# Patient Record
Sex: Female | Born: 1970
Health system: Southern US, Community
[De-identification: ages and names within clinical notes are randomized; demographics above are authoritative.]

## PROBLEM LIST (undated history)

## (undated) HISTORY — PX: NEPHRECTOMY: SHX65

---

## 1998-04-10 ENCOUNTER — Other Ambulatory Visit: Admission: RE | Admit: 1998-04-10 | Discharge: 1998-04-10 | Payer: Self-pay | Admitting: Obstetrics and Gynecology

## 1998-05-05 ENCOUNTER — Encounter: Payer: Self-pay | Admitting: Obstetrics and Gynecology

## 1998-05-05 ENCOUNTER — Ambulatory Visit (HOSPITAL_COMMUNITY): Admission: RE | Admit: 1998-05-05 | Discharge: 1998-05-05 | Payer: Self-pay | Admitting: Obstetrics and Gynecology

## 1998-08-21 ENCOUNTER — Encounter (HOSPITAL_COMMUNITY): Admission: RE | Admit: 1998-08-21 | Discharge: 1998-08-23 | Payer: Self-pay | Admitting: Obstetrics

## 1998-08-22 ENCOUNTER — Inpatient Hospital Stay (HOSPITAL_COMMUNITY): Admission: AD | Admit: 1998-08-22 | Discharge: 1998-08-24 | Payer: Self-pay | Admitting: *Deleted

## 1998-12-15 ENCOUNTER — Inpatient Hospital Stay (HOSPITAL_COMMUNITY): Admission: AD | Admit: 1998-12-15 | Discharge: 1998-12-15 | Payer: Self-pay | Admitting: Obstetrics & Gynecology

## 1998-12-16 ENCOUNTER — Encounter (HOSPITAL_COMMUNITY): Admission: RE | Admit: 1998-12-16 | Discharge: 1999-03-16 | Payer: Self-pay | Admitting: Obstetrics & Gynecology

## 2003-02-07 ENCOUNTER — Emergency Department (HOSPITAL_COMMUNITY): Admission: EM | Admit: 2003-02-07 | Discharge: 2003-02-07 | Payer: Self-pay | Admitting: Emergency Medicine

## 2003-08-20 ENCOUNTER — Emergency Department (HOSPITAL_COMMUNITY): Admission: EM | Admit: 2003-08-20 | Discharge: 2003-08-21 | Payer: Self-pay | Admitting: Emergency Medicine

## 2004-11-06 ENCOUNTER — Inpatient Hospital Stay (HOSPITAL_COMMUNITY): Admission: AD | Admit: 2004-11-06 | Discharge: 2004-11-06 | Payer: Self-pay | Admitting: *Deleted

## 2005-06-29 ENCOUNTER — Inpatient Hospital Stay (HOSPITAL_COMMUNITY): Admission: AD | Admit: 2005-06-29 | Discharge: 2005-07-01 | Payer: Self-pay | Admitting: Obstetrics & Gynecology

## 2005-08-13 ENCOUNTER — Other Ambulatory Visit: Admission: RE | Admit: 2005-08-13 | Discharge: 2005-08-13 | Payer: Self-pay | Admitting: Obstetrics and Gynecology

## 2006-02-22 ENCOUNTER — Emergency Department (HOSPITAL_COMMUNITY): Admission: EM | Admit: 2006-02-22 | Discharge: 2006-02-22 | Payer: Self-pay | Admitting: Emergency Medicine

## 2006-06-19 ENCOUNTER — Emergency Department (HOSPITAL_COMMUNITY): Admission: EM | Admit: 2006-06-19 | Discharge: 2006-06-19 | Payer: Self-pay | Admitting: Emergency Medicine

## 2007-02-16 ENCOUNTER — Ambulatory Visit: Payer: Self-pay | Admitting: Internal Medicine

## 2007-02-16 ENCOUNTER — Encounter (INDEPENDENT_AMBULATORY_CARE_PROVIDER_SITE_OTHER): Payer: Self-pay | Admitting: Family Medicine

## 2007-02-16 LAB — CONVERTED CEMR LAB
ALT: 14 units/L (ref 0–35)
Albumin: 4.3 g/dL (ref 3.5–5.2)
Alkaline Phosphatase: 111 units/L (ref 39–117)
Basophils Absolute: 0 10*3/uL (ref 0.0–0.1)
Eosinophils Absolute: 0 10*3/uL — ABNORMAL LOW (ref 0.2–0.7)
Eosinophils Relative: 0 % (ref 0–5)
Glucose, Bld: 81 mg/dL (ref 70–99)
LDL Cholesterol: 126 mg/dL — ABNORMAL HIGH (ref 0–99)
Lymphocytes Relative: 44 % (ref 12–46)
Lymphs Abs: 2.1 10*3/uL (ref 0.7–4.0)
MCV: 85.6 fL (ref 78.0–100.0)
Neutrophils Relative %: 49 % (ref 43–77)
Platelets: 321 10*3/uL (ref 150–400)
Potassium: 4.4 meq/L (ref 3.5–5.3)
RDW: 12.9 % (ref 11.5–15.5)
Sodium: 136 meq/L (ref 135–145)
Total Bilirubin: 0.4 mg/dL (ref 0.3–1.2)
Total Protein: 8.1 g/dL (ref 6.0–8.3)
Triglycerides: 114 mg/dL (ref <150)
VLDL: 23 mg/dL (ref 0–40)
WBC: 4.8 10*3/uL (ref 4.0–10.5)

## 2007-03-02 ENCOUNTER — Encounter: Admission: RE | Admit: 2007-03-02 | Discharge: 2007-03-30 | Payer: Self-pay | Admitting: Family Medicine

## 2007-03-24 ENCOUNTER — Ambulatory Visit: Payer: Self-pay | Admitting: Internal Medicine

## 2007-03-24 ENCOUNTER — Ambulatory Visit: Payer: Self-pay | Admitting: *Deleted

## 2007-04-10 ENCOUNTER — Ambulatory Visit: Payer: Self-pay | Admitting: Internal Medicine

## 2007-07-14 ENCOUNTER — Ambulatory Visit: Payer: Self-pay | Admitting: Internal Medicine

## 2007-09-30 ENCOUNTER — Ambulatory Visit: Payer: Self-pay | Admitting: Family Medicine

## 2007-09-30 LAB — CONVERTED CEMR LAB
ALT: 11 units/L (ref 0–35)
AST: 15 units/L (ref 0–37)
Albumin: 4 g/dL (ref 3.5–5.2)
CO2: 24 meq/L (ref 19–32)
Calcium: 9.4 mg/dL (ref 8.4–10.5)
Chloride: 105 meq/L (ref 96–112)
Cholesterol: 152 mg/dL (ref 0–200)
Potassium: 4.4 meq/L (ref 3.5–5.3)
Sodium: 138 meq/L (ref 135–145)
Total CHOL/HDL Ratio: 3.5
Total Protein: 7.1 g/dL (ref 6.0–8.3)

## 2008-03-22 ENCOUNTER — Ambulatory Visit: Payer: Self-pay | Admitting: Family Medicine

## 2008-03-23 ENCOUNTER — Emergency Department (HOSPITAL_COMMUNITY): Admission: EM | Admit: 2008-03-23 | Discharge: 2008-03-23 | Payer: Self-pay | Admitting: Emergency Medicine

## 2008-04-05 ENCOUNTER — Ambulatory Visit: Payer: Self-pay | Admitting: Family Medicine

## 2008-11-03 ENCOUNTER — Ambulatory Visit: Payer: Self-pay | Admitting: Family Medicine

## 2010-06-19 ENCOUNTER — Emergency Department (HOSPITAL_COMMUNITY)
Admission: EM | Admit: 2010-06-19 | Discharge: 2010-06-20 | Disposition: A | Discharge status: Home / Self Care | Payer: Medicaid Other | Attending: Emergency Medicine | Admitting: Emergency Medicine

## 2010-06-19 ENCOUNTER — Emergency Department (HOSPITAL_COMMUNITY): Payer: Medicaid Other

## 2010-06-19 DIAGNOSIS — R531 Weakness: Secondary | ICD-10-CM

## 2010-06-19 DIAGNOSIS — R5381 Other malaise: Secondary | ICD-10-CM | POA: Insufficient documentation

## 2010-06-19 DIAGNOSIS — R509 Fever, unspecified: Secondary | ICD-10-CM | POA: Insufficient documentation

## 2010-06-19 DIAGNOSIS — R059 Cough, unspecified: Secondary | ICD-10-CM | POA: Insufficient documentation

## 2010-06-19 DIAGNOSIS — IMO0001 Reserved for inherently not codable concepts without codable children: Secondary | ICD-10-CM | POA: Insufficient documentation

## 2010-06-19 DIAGNOSIS — R5383 Other fatigue: Secondary | ICD-10-CM | POA: Insufficient documentation

## 2010-06-19 DIAGNOSIS — R42 Dizziness and giddiness: Secondary | ICD-10-CM | POA: Insufficient documentation

## 2010-06-19 DIAGNOSIS — R112 Nausea with vomiting, unspecified: Secondary | ICD-10-CM | POA: Insufficient documentation

## 2010-06-19 DIAGNOSIS — R300 Dysuria: Secondary | ICD-10-CM | POA: Insufficient documentation

## 2010-06-19 DIAGNOSIS — R05 Cough: Secondary | ICD-10-CM | POA: Insufficient documentation

## 2010-06-19 LAB — URINALYSIS, ROUTINE W REFLEX MICROSCOPIC
Glucose, UA: NEGATIVE mg/dL
Ketones, ur: NEGATIVE mg/dL
Leukocytes, UA: NEGATIVE
Nitrite: NEGATIVE
Specific Gravity, Urine: 1.03 (ref 1.005–1.030)
pH: 6.5 (ref 5.0–8.0)

## 2010-06-19 LAB — CBC
HCT: 38.6 % (ref 36.0–46.0)
MCHC: 33.2 g/dL (ref 30.0–36.0)
MCV: 87.3 fL (ref 78.0–100.0)
Platelets: 250 10*3/uL (ref 150–400)
RDW: 12.2 % (ref 11.5–15.5)

## 2010-06-19 LAB — DIFFERENTIAL
Basophils Absolute: 0 10*3/uL (ref 0.0–0.1)
Eosinophils Absolute: 0.1 10*3/uL (ref 0.0–0.7)
Eosinophils Relative: 2 % (ref 0–5)
Lymphocytes Relative: 57 % — ABNORMAL HIGH (ref 12–46)
Monocytes Absolute: 0.5 10*3/uL (ref 0.1–1.0)

## 2010-06-19 LAB — URINE MICROSCOPIC-ADD ON

## 2010-06-19 LAB — PREGNANCY, URINE: Preg Test, Ur: NEGATIVE

## 2010-06-20 ENCOUNTER — Emergency Department (HOSPITAL_COMMUNITY): Payer: Medicaid Other

## 2010-06-20 LAB — BASIC METABOLIC PANEL
BUN: 17 mg/dL (ref 6–23)
Calcium: 9.2 mg/dL (ref 8.4–10.5)
GFR calc non Af Amer: >60 mL/min (ref >60)
Glucose, Bld: 98 mg/dL (ref 70–99)

## 2010-06-21 LAB — URINE CULTURE

## 2010-08-24 NOTE — H&P (Signed)
[name], [name]             ACCOUNT NO.:  [id number]   MEDICAL RECORD NO.:  [id number]          PATIENT TYPE:  INP   LOCATION:  9174                          FACILITY:  [facility]   PHYSICIAN:  Hal Morales, M.D.DATE OF BIRTH:  [birth year]   DATE OF ADMISSION:  06/29/2005  DATE OF DISCHARGE:                                HISTORY & PHYSICAL   HISTORY OF PRESENT ILLNESS:  Ms. [name] is a 40 year old gravida 5, para 4-  0-0-4, who presents at 26 and 6/7 weeks with complaints of painful regular  contractions since 5 p.m. day of admission.  The patient denies leakage of  fluid or bleeding.  The patient reports her fetus has been moving normally.  The patient reports that she has had some bloody mucus.  The patient reports  her contractions are approximately every 5-7 minutes at the present time.  Pregnancy remarkable for:  1.  Grand multiparity.  2.  History of previous clitoridectomy, partial.  3.  History of rapid labors.  4.  History of maternal atrophic left kidney with lithiasis and functionally      solitary right kidney.  5.  Fetal pyelectasis.   The patient denies headaches, vision changes, right upper quadrant pain or  edema.   PRENATAL LABORATORY:  Blood type is O positive.  Antibody screen negative.  Initial hemoglobin 10.9, hemoglobin at 27 weeks 11.4 and platelets 226,000.  Rubella titer immune, hepatitis B surface antigen negative, syphilis  nonreactive, HIV was declined, initial gonorrhea and Chlamydia negative,  group B strep was negative and 1-hour Glucola 107.  Cystic fibrosis screen  negative and quad screen declined.   HISTORY OF PRESENT PREGNANCY:  The patient entered care at [redacted] weeks  gestation.  The pregnancy has been followed by the CNM Service at [facility].  The patient has been evaluated by nephrology during the  course of her pregnancy secondary to her history of atrophic left kidney.  Ultrasound done at 18 weeks for anatomy with  limited anatomy visualized at  that point in time.  Normal amniotic fluid volume was noted.  Cervix 6.69  cm.  The patient with a UTI at 21 weeks' gestation with a positive urine  culture for E coli treated with Macrobid.  Repeat culture done at 22 weeks  was negative.  Anatomy ultrasound completed at 22 weeks.  Glucola  administered at 26 weeks with negative results as noted above.  The patient  was placed on Protonix for complaints of GERD at 26 weeks.  Monthly Chem-9  at 26 weeks was negative.  The patient with left flank pain at 27 weeks.  Urine sent to culture.  The patient given Vicodin at that time for back  pain.  Urine culture repeated at 28 weeks and was negative.  Ultrasound was  repeated at 30 weeks secondary to size greater than dates.  At that time,  AFI 23.1 cm, estimated fetal weight 58th percentile and bilateral mild  pyelectasis noted.  Right renal pelvis measuring 0.65 cm and left renal  pelvis 0.63 cm.  Ultrasound repeated 32 weeks for amniotic fluid index  with  a normal amniotic fluid index.  At that point in time, the estimated fetal  weight at the 80th percentile per [place].  Bilateral renal pyelectasis  with the right slightly increased to 0.83 cm and the left stable.  Ultrasound repeated at 37 weeks for fetal renal pyelectasis noting right  renal pyelectasis to be stable at 0.8 cm and left-sided pyelectasis  resolved, normal amniotic fluid index 13 cm and cervix 3.2 cm.  The patient  declined third trimester gonorrhea and Chlamydia cultures, and GBS was  obtained.  Chem-9 was negative.  Complete metabolic panel repeated at 38  weeks, revealed a normal BUN and creatinine, and group B strep negative.  The remainder of the patient's pregnancy has been unremarkable.  The  patient's last menstrual period September 23, 2004 with an [facility] of June 30, 2005,  which was confirmed by ultrasound.   OBSTETRIC HISTORY:  Pregnancy #1 August of 1992, spontaneous vaginal  delivery,  female infant approximately 7 pounds at term gestation, 11-hour  labor and fetus born in [place].  December 1995, spontaneous vaginal delivery  at term gestation, female infant approximately 6 pounds, 2-hour labor and born  in [place] with no complications.  October of 1997, spontaneous vaginal  delivery, female infant approximately 7 pounds at term gestation, 6-hour labor  and born in [place] with no complications.  May of 2000, spontaneous vaginal  delivery, female infant, 7 pounds 6 ounces at 15 weeks' gestation, 4-hour  labor at [facility] in [place] with no complications and current  pregnancy is #5.   GYNECOLOGIC HISTORY:  The patient denies history of abnormal Paps or STDs.  The patient reports regular monthly menses.   ALLERGIES:  NO KNOWN DRUG ALLERGIES.   CURRENT MEDICATIONS:  Prenatal vitamins only.   FAMILY HISTORY:  Mother with diabetes, otherwise negative.   GENETIC HISTORY:  Negative.   SOCIAL HISTORY:  The patient is married to the father of the baby, Saifal  Nael.  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  The  father of the baby has 12 years of education and works in distribution  center.  The patient and her husband are Muslim in their faith.  The patient  denies use of alcohol, tobacco or street drugs.   PHYSICAL EXAMINATION:  VITAL SIGNS:  The patient is afebrile.  Vital signs  are stable.  HEENT:  Within normal limits.  LUNGS:  Clear.  HEART:  Regular rate and rhythm.  BREASTS:  Soft.  ABDOMEN:  Soft, gravid and nontender with fundal height extending 40 cm  above the symphysis pubis.  Fetal heart rate 150s-160s with variability  noted, occasional mild variables are noted and reactivity is present.  Uterine contractions every 5-7 minutes.  CERVIX:  4 cm dilated, 80 % effaced and -2 station.  EXTREMITIES:  No edema, DTRs are 1+, no clonus and negative Homans.   ASSESSMENT:  1.  Intrauterine pregnancy at 79 and 6/7 weeks.  2.  Active  labor.   PLAN:  1.  The patient will be admitted to birthing suites per a consult with Dr.      Dierdre Forth.  2.  Routine CNM orders.  3.  The patient plans epidural, which was ordered.  The patient with      complete metabolic panel ordered, as well as PIH labs secondary to      solitary elevated blood pressure on admission of 146/95 and we check a  clean-catch urine for protein.  The patient with no history of      hypertension.      Rhona Leavens, CNM      Hal Morales, M.D.  Electronically Signed    NOS/MEDQ  D:  06/29/2005  T:  07/01/2005  Job:  [id number]

## 2010-11-07 ENCOUNTER — Emergency Department (HOSPITAL_COMMUNITY)
Admission: EM | Admit: 2010-11-07 | Discharge: 2010-11-07 | Disposition: A | Discharge status: Home / Self Care | Payer: Self-pay | Attending: Emergency Medicine | Admitting: Emergency Medicine

## 2010-11-07 ENCOUNTER — Emergency Department (HOSPITAL_COMMUNITY): Payer: Self-pay

## 2010-11-07 DIAGNOSIS — R6883 Chills (without fever): Secondary | ICD-10-CM | POA: Insufficient documentation

## 2010-11-07 DIAGNOSIS — R079 Chest pain, unspecified: Secondary | ICD-10-CM | POA: Insufficient documentation

## 2010-11-07 DIAGNOSIS — IMO0001 Reserved for inherently not codable concepts without codable children: Secondary | ICD-10-CM | POA: Insufficient documentation

## 2010-11-07 LAB — URINE MICROSCOPIC-ADD ON

## 2010-11-07 LAB — URINALYSIS, ROUTINE W REFLEX MICROSCOPIC
Glucose, UA: NEGATIVE mg/dL
Leukocytes, UA: NEGATIVE
Protein, ur: NEGATIVE mg/dL
Specific Gravity, Urine: 1.021 (ref 1.005–1.030)
pH: 6 (ref 5.0–8.0)

## 2010-11-07 LAB — POCT I-STAT, CHEM 8
BUN: 12 mg/dL (ref 6–23)
Chloride: 106 mEq/L (ref 96–112)
Creatinine, Ser: 0.8 mg/dL (ref 0.50–1.10)
Potassium: 4.4 mEq/L (ref 3.5–5.1)
Sodium: 138 mEq/L (ref 135–145)

## 2011-01-11 LAB — BASIC METABOLIC PANEL
CO2: 26 mEq/L (ref 19–32)
Calcium: 9.8 mg/dL (ref 8.4–10.5)
GFR calc Af Amer: >60 mL/min (ref >60)
GFR calc non Af Amer: >60 mL/min (ref >60)
Sodium: 139 mEq/L (ref 135–145)

## 2011-01-11 LAB — URINALYSIS, ROUTINE W REFLEX MICROSCOPIC
Bilirubin Urine: NEGATIVE
Specific Gravity, Urine: 1.012 (ref 1.005–1.030)
Urobilinogen, UA: 1 mg/dL (ref 0.0–1.0)

## 2011-01-11 LAB — DIFFERENTIAL
Basophils Absolute: 0.1 10*3/uL (ref 0.0–0.1)
Basophils Relative: 2 % — ABNORMAL HIGH (ref 0–1)
Eosinophils Absolute: 0.1 10*3/uL (ref 0.0–0.7)
Eosinophils Relative: 1 % (ref 0–5)
Monocytes Absolute: 0.4 10*3/uL (ref 0.1–1.0)
Monocytes Relative: 8 % (ref 3–12)
Neutro Abs: 2.7 10*3/uL (ref 1.7–7.7)

## 2011-01-11 LAB — CBC
Hemoglobin: 13.6 g/dL (ref 12.0–15.0)
RBC: 4.72 MIL/uL (ref 3.87–5.11)

## 2011-01-11 LAB — URINE MICROSCOPIC-ADD ON

## 2013-03-20 ENCOUNTER — Ambulatory Visit (INDEPENDENT_AMBULATORY_CARE_PROVIDER_SITE_OTHER): Payer: BC Managed Care – PPO | Admitting: Internal Medicine

## 2013-03-20 VITALS — BP 132/80 | HR 67 | Temp 98.0°F | Resp 16 | Ht 63.0 in | Wt 177.6 lb

## 2013-03-20 DIAGNOSIS — R109 Unspecified abdominal pain: Secondary | ICD-10-CM

## 2013-03-20 DIAGNOSIS — R350 Frequency of micturition: Secondary | ICD-10-CM

## 2013-03-20 DIAGNOSIS — R42 Dizziness and giddiness: Secondary | ICD-10-CM

## 2013-03-20 LAB — POCT URINALYSIS DIPSTICK
Bilirubin, UA: NEGATIVE
Blood, UA: NEGATIVE
Glucose, UA: NEGATIVE
Ketones, UA: NEGATIVE
Leukocytes, UA: NEGATIVE
Nitrite, UA: NEGATIVE
Protein, UA: NEGATIVE
Spec Grav, UA: <=1.005
Urobilinogen, UA: 0.2
pH, UA: 6

## 2013-03-20 LAB — POCT UA - MICROSCOPIC ONLY
Bacteria, U Microscopic: NEGATIVE
Casts, Ur, LPF, POC: NEGATIVE
Crystals, Ur, HPF, POC: NEGATIVE
Epithelial cells, urine per micros: NEGATIVE
Mucus, UA: NEGATIVE
RBC, urine, microscopic: NEGATIVE
WBC, Ur, HPF, POC: NEGATIVE
Yeast, UA: NEGATIVE

## 2013-03-20 MED ORDER — CIPROFLOXACIN HCL 250 MG PO TABS: 250.0000 mg | ORAL_TABLET | Freq: Two times a day (BID) | ORAL | Status: DC

## 2013-03-20 NOTE — Progress Notes (Addendum)
Subjective:  This chart was scribed for Anne Sia, MD by Carl Best, Medical Scribe. This patient was seen in Room 13 and the patient's care was started at 5:40 PM.  Patient ID: [name], female    DOB: [birth year], 42 y.o.   MRN: [id number]  HPI HPI Comments: [name] is a 42 y.o. female who presents to the [facility] complaining of left-sided flank pain that started a week ago and changes in her urine that started today.  She denies fever as an associated symptom.  She lists dysuria and frequency as associated symptoms.  She states that she has experienced a UTI in the past.  The patient denies having a history of infection in her kidney stones in the past.  She denies being on any types of medication.  She is not dizzy as noted in the presenting complaint.  History reviewed. No pertinent past medical history. Past Surgical History  Procedure Laterality Date  . Nephrectomy     Family History  Problem Relation Age of Onset  . Diabetes Mother    History   Social History  . Marital Status: Married    Spouse Name: N/A    Number of Children: N/A  . Years of Education: N/A   Occupational History  . Not on file.   Social History Main Topics  . Smoking status: Never Smoker   . Smokeless tobacco: Not on file  . Alcohol Use: No  . Drug Use: No  . Sexual Activity: Not on file   Other Topics Concern  . Not on file   Social History Narrative  . No narrative on file   No Known Allergies   Review of Systems  Constitutional: Negative for fever.  Genitourinary: Positive for dysuria and frequency.  All other systems reviewed and are negative.     Objective:  Physical Exam  Nursing note and vitals reviewed. Constitutional: She is oriented to person, place, and time. She appears well-developed and well-nourished. No distress.  HENT:  Head: Normocephalic and atraumatic.  Eyes: Conjunctivae and EOM are normal. Pupils are equal, round,  and reactive to light.  Neck: Neck supple. No thyromegaly present.  Cardiovascular: Normal rate, regular rhythm and normal heart sounds.   Pulmonary/Chest: Effort normal and breath sounds normal.  Abdominal: Soft. Bowel sounds are normal. She exhibits no distension. There is no tenderness. There is no rebound.  Mildly tender in the flanks to percussion  Lymphadenopathy:    She has no cervical adenopathy.  Neurological: She is alert and oriented to person, place, and time. No cranial nerve deficit.  Psychiatric: She has a normal mood and affect. Her behavior is normal.   BP 132/80  Pulse 67  Temp(Src) 98 F (36.7 C) (Oral)  Resp 16  Ht 5\' 3"  (1.6 m)  Wt 177 lb 9.6 oz (80.559 kg)  BMI 31.47 kg/m2  SpO2 100%  Results for orders placed in visit on 03/20/13  POCT UA - MICROSCOPIC ONLY      Result Value Range   WBC, Ur, HPF, POC neg     RBC, urine, microscopic neg     Bacteria, U Microscopic neg     Mucus, UA neg     Epithelial cells, urine per micros neg     Crystals, Ur, HPF, POC neg     Casts, Ur, LPF, POC neg     Yeast, UA neg    POCT URINALYSIS DIPSTICK      Result Value Range  Color, UA bright yellow \     Clarity, UA clear     Glucose, UA neg     Bilirubin, UA neg     Ketones, UA neg     Spec Grav, UA <=1.005     Blood, UA neg     pH, UA 6.0     Protein, UA neg     Urobilinogen, UA 0.2     Nitrite, UA neg     Leukocytes, UA Negative         Assessment & Plan:   I personally performed the services described in this documentation, which was scribed in my presence. The recorded information has been reviewed and is accurate. I have completed the patient encounter in its entirety as documented by the scribe, with editing by me where necessary. Mekel Haverstock P. Merla Riches, M.D. Urinary frequency/flank discomfort- Plan: Urine culture  "Cover with Cipro due to past history awaiting culture results

## 2013-03-22 LAB — URINE CULTURE
Colony Count: NO GROWTH
Organism ID, Bacteria: NO GROWTH

## 2013-03-25 ENCOUNTER — Ambulatory Visit: Payer: BC Managed Care – PPO

## 2013-06-19 ENCOUNTER — Ambulatory Visit (INDEPENDENT_AMBULATORY_CARE_PROVIDER_SITE_OTHER): Payer: BC Managed Care – PPO | Admitting: Emergency Medicine

## 2013-06-19 VITALS — BP 128/74 | HR 78 | Temp 98.8°F | Resp 17 | Ht 61.0 in | Wt 166.0 lb

## 2013-06-19 DIAGNOSIS — R197 Diarrhea, unspecified: Secondary | ICD-10-CM

## 2013-06-19 LAB — POCT CBC
Granulocyte percent: 57.9 %G (ref 37–80)
HCT, POC: 43.5 % (ref 37.7–47.9)
HEMOGLOBIN: 13.9 g/dL (ref 12.2–16.2)
LYMPH, POC: 1.2 (ref 0.6–3.4)
MCH: 28.8 pg (ref 27–31.2)
MCHC: 32 g/dL (ref 31.8–35.4)
MCV: 90.2 fL (ref 80–97)
MID (CBC): 0.4 (ref 0–0.9)
MPV: 9.3 fL (ref 0–99.8)
PLATELET COUNT, POC: 296 10*3/uL (ref 142–424)
POC Granulocyte: 2.2 (ref 2–6.9)
POC LYMPH PERCENT: 30.9 %L (ref 10–50)
POC MID %: 11.2 % (ref 0–12)
RBC: 4.82 M/uL (ref 4.04–5.48)
RDW, POC: 12.6 %
WBC: 3.8 10*3/uL — AB (ref 4.6–10.2)

## 2013-06-19 MED ORDER — ONDANSETRON 8 MG PO TBDP: 8.0000 mg | ORAL_TABLET | Freq: Three times a day (TID) | ORAL | Status: DC | PRN

## 2013-06-19 NOTE — Progress Notes (Addendum)
   Subjective:    Patient ID: [name] Strojny, female    DOB: 10/26/1970, 43 y.o.   MRN: [id number] This chart was scribed for Lesle ChrisSteven Daub, MD by Nicholos Johnsenise Iheanachor, Medical Scribe. This patient's care was started at 2:20 PM.  Abdominal Pain Associated symptoms include diarrhea and vomiting. Pertinent negatives include no fever.  Diarrhea  Associated symptoms include abdominal pain, chills and vomiting. Pertinent negatives include no fever.   HPI Comments: [name] Housh is a 43 y.o. female who presents to the [facility] complaining of intermittent chills, abdominal pain, diarrhea, onset 3 days ago. States diarrhea was really bad 3 days ago, got better on day 2 and has since gotten worse today. Reports 7 episodes of diarrhea today; watery and yellow in color. States abdominal pain feels like a cramping sensation when she used the bathroom. Also reports some episodes of emesis. Has not taken any medication for symptoms. Was on antibiotics in 12/14 for a UTI. Denies sick contacts. Denies any recent travel. Denies any other medical problems. Denies fever, sore throat, congestion, cough, hematochezia  Review of Systems  Constitutional: Positive for chills. Negative for fever.  HENT: Negative for congestion and sore throat.   Gastrointestinal: Positive for vomiting, abdominal pain and diarrhea. Negative for blood in stool.    Objective:  Physical Exam  CONSTITUTIONAL: Well developed/well nourished HEAD: Normocephalic/atraumatic EYES: EOMI/PERRL ENMT: Mucous membranes moist NECK: supple no meningeal signs SPINE:entire spine nontender CV: S1/S2 noted, no murmurs/rubs/gallops noted LUNGS: Lungs are clear to auscultation bilaterally, no apparent distress ABDOMEN: soft, nontender, no rebound or guarding GU:no cva tenderness NEURO: Pt is awake/alert, moves all extremitiesx4 EXTREMITIES: pulses normal, full ROM SKIN: warm, color normal PSYCH: no abnormalities of mood  noted Results for orders placed in visit on 06/19/13  POCT CBC      Result Value Ref Range   WBC 3.8 (*) 4.6 - 10.2 K/uL   Lymph, poc 1.2  0.6 - 3.4   POC LYMPH PERCENT 30.9  10 - 50 %L   MID (cbc) 0.4  0 - 0.9   POC MID % 11.2  0 - 12 %M   POC Granulocyte 2.2  2 - 6.9   Granulocyte percent 57.9  37 - 80 %G   RBC 4.82  4.04 - 5.48 M/uL   Hemoglobin 13.9  12.2 - 16.2 g/dL   HCT, POC 40.943.5  81.137.7 - 47.9 %   MCV 90.2  80 - 97 fL   MCH, POC 28.8  27 - 31.2 pg   MCHC 32.0  31.8 - 35.4 g/dL   RDW, POC 91.412.6     Platelet Count, POC 296  142 - 424 K/uL   MPV 9.3  0 - 99.8 fL   Assessment & Plan:  This appears to be a viral gastroenteritis with a white count of 3800. She did take antibiotics in December but has not been on any antibiotics recently and her count appears viral . We'll treat symptomatically with Zofran and Imodium A-D. I personally performed the services described in this documentation, which was scribed in my presence. The recorded information has been reviewed and is accurate.

## 2013-06-19 NOTE — Patient Instructions (Signed)
Viral Gastroenteritis Viral gastroenteritis is also known as stomach flu. This condition affects the stomach and intestinal tract. It can cause sudden diarrhea and vomiting. The illness typically lasts 3 to 8 days. Most people develop an immune response that eventually gets rid of the virus. While this natural response develops, the virus can make you quite ill. CAUSES  Many different viruses can cause gastroenteritis, such as rotavirus or noroviruses. You can catch one of these viruses by consuming contaminated food or water. You may also catch a virus by sharing utensils or other personal items with an infected person or by touching a contaminated surface. SYMPTOMS  The most common symptoms are diarrhea and vomiting. These problems can cause a severe loss of body fluids (dehydration) and a body salt (electrolyte) imbalance. Other symptoms may include:  Fever.  Headache.  Fatigue.  Abdominal pain. DIAGNOSIS  Your caregiver can usually diagnose viral gastroenteritis based on your symptoms and a physical exam. A stool sample may also be taken to test for the presence of viruses or other infections. TREATMENT  This illness typically goes away on its own. Treatments are aimed at rehydration. The most serious cases of viral gastroenteritis involve vomiting so severely that you are not able to keep fluids down. In these cases, fluids must be given through an intravenous line (IV). HOME CARE INSTRUCTIONS   Drink enough fluids to keep your urine clear or pale yellow. Drink small amounts of fluids frequently and increase the amounts as tolerated.  Ask your caregiver for specific rehydration instructions.  Avoid:  Foods high in sugar.  Alcohol.  Carbonated drinks.  Tobacco.  Juice.  Caffeine drinks.  Extremely hot or cold fluids.  Fatty, greasy foods.  Too much intake of anything at one time.  Dairy products until 24 to 48 hours after diarrhea stops.  You may consume probiotics.  Probiotics are active cultures of beneficial bacteria. They may lessen the amount and number of diarrheal stools in adults. Probiotics can be found in yogurt with active cultures and in supplements.  Wash your hands well to avoid spreading the virus.  Only take over-the-counter or prescription medicines for pain, discomfort, or fever as directed by your caregiver. Do not give aspirin to children. Antidiarrheal medicines are not recommended.  Ask your caregiver if you should continue to take your regular prescribed and over-the-counter medicines.  Keep all follow-up appointments as directed by your caregiver. SEEK IMMEDIATE MEDICAL CARE IF:   You are unable to keep fluids down.  You do not urinate at least once every 6 to 8 hours.  You develop shortness of breath.  You notice blood in your stool or vomit. This may look like coffee grounds.  You have abdominal pain that increases or is concentrated in one small area (localized).  You have persistent vomiting or diarrhea.  You have a fever.  The patient is a child younger than 3 months, and he or she has a fever.  The patient is a child older than 3 months, and he or she has a fever and persistent symptoms.  The patient is a child older than 3 months, and he or she has a fever and symptoms suddenly get worse.  The patient is a baby, and he or she has no tears when crying. MAKE SURE YOU:   Understand these instructions.  Will watch your condition.  Will get help right away if you are not doing well or get worse. Document Released: 03/25/2005 Document Revised: 06/17/2011 Document Reviewed: 01/09/2011   [employer] Patient Information 2014 [employer].  

## 2014-04-17 ENCOUNTER — Emergency Department (HOSPITAL_COMMUNITY)
Admission: EM | Admit: 2014-04-17 | Discharge: 2014-04-17 | Disposition: A | Discharge status: Home / Self Care | Payer: BLUE CROSS/BLUE SHIELD | Attending: Emergency Medicine | Admitting: Emergency Medicine

## 2014-04-17 ENCOUNTER — Encounter (HOSPITAL_COMMUNITY): Payer: Self-pay | Admitting: *Deleted

## 2014-04-17 ENCOUNTER — Emergency Department (HOSPITAL_COMMUNITY): Payer: BLUE CROSS/BLUE SHIELD

## 2014-04-17 DIAGNOSIS — R059 Cough, unspecified: Secondary | ICD-10-CM

## 2014-04-17 DIAGNOSIS — R0789 Other chest pain: Secondary | ICD-10-CM | POA: Insufficient documentation

## 2014-04-17 DIAGNOSIS — R0981 Nasal congestion: Secondary | ICD-10-CM | POA: Insufficient documentation

## 2014-04-17 DIAGNOSIS — Z7982 Long term (current) use of aspirin: Secondary | ICD-10-CM | POA: Insufficient documentation

## 2014-04-17 DIAGNOSIS — R079 Chest pain, unspecified: Secondary | ICD-10-CM

## 2014-04-17 DIAGNOSIS — R05 Cough: Secondary | ICD-10-CM | POA: Insufficient documentation

## 2014-04-17 DIAGNOSIS — E876 Hypokalemia: Secondary | ICD-10-CM | POA: Diagnosis not present

## 2014-04-17 LAB — BASIC METABOLIC PANEL
ANION GAP: 7 (ref 5–15)
BUN: 15 mg/dL (ref 6–23)
CALCIUM: 8.7 mg/dL (ref 8.4–10.5)
CO2: 23 mmol/L (ref 19–32)
Chloride: 105 mEq/L (ref 96–112)
Creatinine, Ser: 0.71 mg/dL (ref 0.50–1.10)
GFR calc Af Amer: >90 mL/min (ref >90)
GFR calc non Af Amer: >90 mL/min (ref >90)
Glucose, Bld: 86 mg/dL (ref 70–99)
Potassium: 3.4 mmol/L — ABNORMAL LOW (ref 3.5–5.1)
SODIUM: 135 mmol/L (ref 135–145)

## 2014-04-17 LAB — CBC
HEMATOCRIT: 37.6 % (ref 36.0–46.0)
Hemoglobin: 12.4 g/dL (ref 12.0–15.0)
MCH: 28.9 pg (ref 26.0–34.0)
MCHC: 33 g/dL (ref 30.0–36.0)
MCV: 87.6 fL (ref 78.0–100.0)
Platelets: 263 10*3/uL (ref 150–400)
RBC: 4.29 MIL/uL (ref 3.87–5.11)
RDW: 12.2 % (ref 11.5–15.5)
WBC: 6.3 10*3/uL (ref 4.0–10.5)

## 2014-04-17 LAB — I-STAT TROPONIN, ED: TROPONIN I, POC: 0 ng/mL (ref 0.00–0.08)

## 2014-04-17 MED ORDER — NAPROXEN 500 MG PO TABS: 500.0000 mg | ORAL_TABLET | Freq: Two times a day (BID) | ORAL | Status: DC

## 2014-04-17 MED ORDER — POTASSIUM CHLORIDE CRYS ER 20 MEQ PO TBCR
40.0000 meq | EXTENDED_RELEASE_TABLET | Freq: Once | ORAL | Status: AC
  Administered 2014-04-17: 40 meq via ORAL
  Filled 2014-04-17: qty 2

## 2014-04-17 NOTE — ED Provider Notes (Signed)
CSN: [id number]     Arrival date & time 04/17/14  1821 History   First MD Initiated Contact with Patient 04/17/14 1902     Chief Complaint  Patient presents with  . Chest Pain     (Consider location/radiation/quality/duration/timing/severity/associated sxs/prior Treatment) HPI  Pt is a 44yo female presenting to ED with c/o left sided chest pain that started yesterday after coughing and sneezing at home. Pain is intermittent, aching and sore, worse with palpation, certain movements, coughing and sneezing.  Pain is worse today, 8/10. No pain medication taken PTA. Denies fever, chills, n/v/d. No hx of CAD. No FH of CAD.  No hx of HTN.  Denies SOB. No recent travel or sick contacts. Denies hx of PE or DVT. Denies leg pain or swelling.  History reviewed. No pertinent past medical history. Past Surgical History  Procedure Laterality Date  . Nephrectomy     Family History  Problem Relation Age of Onset  . Diabetes Mother    History  Substance Use Topics  . Smoking status: Never Smoker   . Smokeless tobacco: Not on file  . Alcohol Use: No   OB History    No data available     Review of Systems  Constitutional: Negative for fever, chills, diaphoresis, appetite change and fatigue.  HENT: Positive for congestion.   Respiratory: Positive for cough. Negative for shortness of breath.   Cardiovascular: Positive for chest pain. Negative for palpitations and leg swelling.  Gastrointestinal: Negative for nausea, vomiting, abdominal pain and diarrhea.  All other systems reviewed and are negative.     Allergies  Review of patient's allergies indicates no known allergies.  Home Medications   Prior to Admission medications   Medication Sig Start Date End Date Taking? Authorizing Provider  aspirin 81 MG tablet Take 81 mg by mouth daily as needed for pain (pain).   Yes Historical Provider, MD  naproxen (NAPROSYN) 500 MG tablet Take 1 tablet (500 mg total) by mouth 2 (two) times daily.  04/17/14   Junius Finner, PA-C  ondansetron (ZOFRAN-ODT) 8 MG disintegrating tablet Take 1 tablet (8 mg total) by mouth every 8 (eight) hours as needed for nausea. 06/19/13   Collene Gobble, MD   BP 136/82 mmHg  Pulse 68  Temp(Src) 98.3 F (36.8 C) (Oral)  Resp 18  SpO2 100%  LMP 03/22/2014 Physical Exam  Constitutional: She appears well-developed and well-nourished. No distress.  HENT:  Head: Normocephalic and atraumatic.  Eyes: Conjunctivae are normal. No scleral icterus.  Neck: Normal range of motion.  Cardiovascular: Normal rate, regular rhythm and normal heart sounds.   Pulmonary/Chest: Effort normal and breath sounds normal. No respiratory distress. She has no wheezes. She has no rales. She exhibits tenderness.  Left anterior chest tenderness. No respiratory distress. Lungs: CTAB  Abdominal: Soft. Bowel sounds are normal. She exhibits no distension and no mass. There is no tenderness. There is no rebound and no guarding.  Musculoskeletal: Normal range of motion.  Neurological: She is alert.  Skin: Skin is warm and dry. She is not diaphoretic.  Nursing note and vitals reviewed.   ED Course  Procedures (including critical care time) Labs Review Labs Reviewed  BASIC METABOLIC PANEL - Abnormal; Notable for the following:    Potassium 3.4 (*)    All other components within normal limits  CBC  I-STAT TROPOININ, ED    Imaging Review Dg Chest 2 View  04/17/2014   CLINICAL DATA:  Left-sided chest pain radiating to the left  arm.  EXAM: CHEST  2 VIEW  COMPARISON:  11/07/2010  FINDINGS: The heart size and mediastinal contours are within normal limits. Both lungs are clear. The visualized skeletal structures are unremarkable.  IMPRESSION: No active cardiopulmonary disease.   Electronically Signed   By: Elige KoHetal  Patel   On: 04/17/2014 19:36     EKG Interpretation   Date/Time:  Sunday April 17 2014 18:29:58 EST Ventricular Rate:  68 PR Interval:  178 QRS Duration: 104 QT  Interval:  382 QTC Calculation: 406 R Axis:   21 Text Interpretation:  Sinus rhythm Probable left ventricular hypertrophy  Confirmed by Fayrene FearingJAMES  MD, MARK ([id number]) on 04/17/2014 7:39:12 PM Also  confirmed by Fayrene FearingJAMES  MD, MARK ([id number])  on 04/17/2014 7:40:05 PM      MDM   Final diagnoses:  Left-sided chest wall pain  Cough  Hypokalemia   Pt c/o left sided chest pain that started yesterday after coughing and sneezing. Pain is reproducible. Pt is low risk for major cardiac event per HEART score.  PERC negative.  ECG: unremarkable Troponin: negative for elevation CXR: unremarkable. Mild hypokalemia at 3.4 compared to pt's baseline of 4.3-5.1  Pt given 40mEq K-dur in ED. Pt education instructions for potassium rich food provided. Pt hemodynamically stable for discharge home. Return precautions provided. Pt verbalized understanding and agreement with tx plan.   Junius Finnerrin O'Malley, PA-C 04/18/14 0009  Rolland PorterMark James, MD 04/26/14 [phone]

## 2014-04-17 NOTE — ED Notes (Signed)
Awake. Verbally responsive. A/O x4. Resp even and unlabored. No audible adventitious breath sounds noted. ABC's intact.  

## 2014-04-17 NOTE — ED Notes (Signed)
Pt reports pain in L chest that radiates down left arm after coughing or sneezing since yesterday. Pain worse today. Denies sob, n/v, diaphoresis.

## 2015-05-31 ENCOUNTER — Ambulatory Visit (INDEPENDENT_AMBULATORY_CARE_PROVIDER_SITE_OTHER): Payer: BLUE CROSS/BLUE SHIELD | Admitting: Physician Assistant

## 2015-05-31 VITALS — BP 136/88 | HR 71 | Temp 98.1°F | Resp 16 | Wt 181.0 lb

## 2015-05-31 DIAGNOSIS — F329 Major depressive disorder, single episode, unspecified: Secondary | ICD-10-CM

## 2015-05-31 DIAGNOSIS — F32A Depression, unspecified: Secondary | ICD-10-CM

## 2015-05-31 DIAGNOSIS — R300 Dysuria: Secondary | ICD-10-CM

## 2015-05-31 LAB — POCT URINALYSIS DIP (MANUAL ENTRY)
BILIRUBIN UA: NEGATIVE
BILIRUBIN UA: NEGATIVE
Glucose, UA: NEGATIVE
Leukocytes, UA: NEGATIVE
NITRITE UA: NEGATIVE
Protein Ur, POC: NEGATIVE
Spec Grav, UA: >=1.03
Urobilinogen, UA: 0.2
pH, UA: 5.5

## 2015-05-31 MED ORDER — FLUOXETINE HCL 20 MG PO CAPS
20.0000 mg | ORAL_CAPSULE | Freq: Every day | ORAL | Status: DC
Start: 2015-05-31 — End: 2017-11-16

## 2015-05-31 NOTE — Progress Notes (Signed)
06/04/2015 9:28 AM   DOB: [birth year] / MRN: [id number]  SUBJECTIVE:  [name] is a 45 y.o. female presenting for multiple somatic complaints.  Reports she has been crying much more lately and is not sleeping.  Associates feeling dysthymic and anhedonic.  Reports her youngest son was giving her a very hard time and she had to [place].  Reports all of her kids give her a very hard time.    Complains of ongoing dysuria that is not new.    She has No Known Allergies.   She  has no past medical history on file.    She  reports that she has never smoked. She does not have any smokeless tobacco history on file. She reports that she does not drink alcohol or use illicit drugs. She  has no sexual activity history on file. The patient  has past surgical history that includes Nephrectomy.  Her family history includes Diabetes in her mother.  Review of Systems  Constitutional: Negative for fever and chills.  Eyes: Negative for blurred vision.  Respiratory: Negative for cough and shortness of breath.   Cardiovascular: Negative for chest pain.  Gastrointestinal: Negative for nausea and abdominal pain.  Genitourinary: Negative for dysuria, urgency and frequency.  Musculoskeletal: Negative for myalgias.  Skin: Negative for rash.  Neurological: Negative for dizziness, tingling and headaches.  Psychiatric/Behavioral: Positive for depression. Negative for suicidal ideas, hallucinations, memory loss and substance abuse. The patient is nervous/anxious and has insomnia.     Problem list and medications reviewed and updated by myself where necessary, and exist elsewhere in the encounter.   OBJECTIVE:  BP 136/88 mmHg  Pulse 71  Temp(Src) 98.1 F (36.7 C) (Oral)  Resp 16  Wt 181 lb (82.101 kg)  SpO2 94%  Physical Exam  Constitutional: She is oriented to person, place, and time. She appears well-developed.  Eyes: EOM are normal. Pupils are equal, round,  and reactive to light.  Cardiovascular: Normal rate.   Pulmonary/Chest: Effort normal.  Abdominal: She exhibits no distension.  Musculoskeletal: Normal range of motion.  Neurological: She is alert and oriented to person, place, and time. No cranial nerve deficit.  Skin: Skin is warm and dry. She is not diaphoretic.  Psychiatric: She has a normal mood and affect. Her speech is normal and behavior is normal. Judgment and thought content normal. Cognition and memory are normal.  Vitals reviewed.  Results for orders placed or performed in visit on 05/31/15  TSH  Result Value Ref Range   TSH 0.59 mIU/L  CBC  Result Value Ref Range   WBC 6.3 4.0 - 10.5 K/uL   RBC 4.72 3.87 - 5.11 MIL/uL   Hemoglobin 13.6 12.0 - 15.0 g/dL   HCT 13.0 86.5 - 78.4 %   MCV 85.8 78.0 - 100.0 fL   MCH 28.8 26.0 - 34.0 pg   MCHC 33.6 30.0 - 36.0 g/dL   RDW 69.6 29.5 - 28.4 %   Platelets 338 150 - 400 K/uL   MPV 10.4 8.6 - 12.4 fL  POCT urinalysis dipstick  Result Value Ref Range   Color, UA yellow yellow   Clarity, UA clear clear   Glucose, UA negative negative   Bilirubin, UA negative negative   Ketones, POC UA negative negative   Spec Grav, UA >=1.030    Blood, UA small (A) negative   pH, UA 5.5    Protein Ur, POC negative negative  Urobilinogen, UA 0.2    Nitrite, UA Negative Negative   Leukocytes, UA Negative Negative    ASSESSMENT AND PLAN  [name] was seen today for dysuria and fatigue.  Diagnoses and all orders for this visit:  Depression (emotion): Will start her on low dose Prozac and see her back in 6 weeks.  I have advised that she not miss doses.   -     TSH -     CBC -     FLUoxetine (PROZAC) 20 MG capsule; Take 1 capsule (20 mg total) by mouth daily. Return to clinic in May 2017. -     Cancel: POCT Microscopic Urinalysis ([facility])  Dysuria: Urine is clear and this is not a new problem. Spec grav is high and this may be causing her dysuria.  Advised that she consume more fluid.   -      POCT urinalysis dipstick    The patient was advised to call or return to clinic if she does not see an improvement in symptoms or to seek the care of the closest emergency department if she worsens with the above plan.   Deliah Boston, MHS, PA-C [facility] 06/04/2015 9:28 AM

## 2015-06-01 LAB — TSH: TSH: 0.59 m[IU]/L

## 2015-06-01 LAB — CBC
HEMATOCRIT: 40.5 % (ref 36.0–46.0)
Hemoglobin: 13.6 g/dL (ref 12.0–15.0)
MCH: 28.8 pg (ref 26.0–34.0)
MCHC: 33.6 g/dL (ref 30.0–36.0)
MCV: 85.8 fL (ref 78.0–100.0)
MPV: 10.4 fL (ref 8.6–12.4)
Platelets: 338 10*3/uL (ref 150–400)
RBC: 4.72 MIL/uL (ref 3.87–5.11)
RDW: 12.9 % (ref 11.5–15.5)
WBC: 6.3 10*3/uL (ref 4.0–10.5)

## 2015-06-03 ENCOUNTER — Encounter: Payer: Self-pay | Admitting: *Deleted

## 2015-08-09 ENCOUNTER — Encounter (HOSPITAL_COMMUNITY): Payer: Self-pay | Admitting: Emergency Medicine

## 2015-08-09 ENCOUNTER — Emergency Department (HOSPITAL_COMMUNITY)
Admission: EM | Admit: 2015-08-09 | Discharge: 2015-08-09 | Disposition: A | Discharge status: Home / Self Care | Payer: BLUE CROSS/BLUE SHIELD | Attending: Emergency Medicine | Admitting: Emergency Medicine

## 2015-08-09 DIAGNOSIS — Z79899 Other long term (current) drug therapy: Secondary | ICD-10-CM | POA: Insufficient documentation

## 2015-08-09 DIAGNOSIS — Y9289 Other specified places as the place of occurrence of the external cause: Secondary | ICD-10-CM | POA: Diagnosis not present

## 2015-08-09 DIAGNOSIS — Z7982 Long term (current) use of aspirin: Secondary | ICD-10-CM | POA: Diagnosis not present

## 2015-08-09 DIAGNOSIS — Y998 Other external cause status: Secondary | ICD-10-CM | POA: Diagnosis not present

## 2015-08-09 DIAGNOSIS — W57XXXA Bitten or stung by nonvenomous insect and other nonvenomous arthropods, initial encounter: Secondary | ICD-10-CM | POA: Diagnosis not present

## 2015-08-09 DIAGNOSIS — Y9389 Activity, other specified: Secondary | ICD-10-CM | POA: Diagnosis not present

## 2015-08-09 DIAGNOSIS — S30861A Insect bite (nonvenomous) of abdominal wall, initial encounter: Secondary | ICD-10-CM | POA: Insufficient documentation

## 2015-08-09 MED ORDER — DOXYCYCLINE HYCLATE 100 MG PO CAPS: 100.0000 mg | ORAL_CAPSULE | Freq: Two times a day (BID) | ORAL | Status: DC

## 2015-08-09 NOTE — ED Provider Notes (Signed)
CSN: [id number]     Arrival date & time 08/09/15  1942 History  By signing my name below, I, Tanda RockersMargaux Venter, attest that this documentation has been prepared under the direction and in the presence of Sharilyn SitesLisa Fransico Sciandra, PA-C. Electronically Signed: Tanda RockersMargaux Venter, ED Scribe. 08/09/2015. 8:15 PM.   No chief complaint on file.  The history is provided by the patient and a relative. No language interpreter was used.     HPI Comments: [name] is a 45 y.o. female who presents to the Emergency Department complaining of tick bite to left abdomen x 2-3 days. Daughter reports that they thought it was a new mole at first but when she looked at the area today she noticed legs moving around. Pt denies any pain to the area. Denies fever, chills, rash, body aches or any other associated symptoms.   No past medical history on file. Past Surgical History  Procedure Laterality Date  . Nephrectomy     Family History  Problem Relation Age of Onset  . Diabetes Mother    Social History  Substance Use Topics  . Smoking status: Never Smoker   . Smokeless tobacco: Not on file  . Alcohol Use: No   OB History    No data available     Review of Systems  Constitutional: Negative for fever and chills.  Musculoskeletal: Negative for myalgias and arthralgias.  Skin: Positive for wound (tick bite to left abdomen).  All other systems reviewed and are negative.  Allergies  Review of patient's allergies indicates no known allergies.  Home Medications   Prior to Admission medications   Medication Sig Start Date End Date Taking? Authorizing Provider  aspirin 81 MG tablet Take 81 mg by mouth daily as needed for pain (pain).    Historical Provider, MD  FLUoxetine (PROZAC) 20 MG capsule Take 1 capsule (20 mg total) by mouth daily. Return to clinic in May 2017. 05/31/15   Ofilia NeasMichael L Clark, PA-C   BP 159/94 mmHg  Pulse 73  Temp(Src) 98 F (36.7 C) (Oral)  Resp 20  SpO2 100%   Physical Exam   Constitutional: She is oriented to person, place, and time. She appears well-developed and well-nourished.  HENT:  Head: Normocephalic and atraumatic.  Mouth/Throat: Oropharynx is clear and moist.  Eyes: Conjunctivae and EOM are normal. Pupils are equal, round, and reactive to light.  Neck: Normal range of motion.  Cardiovascular: Normal rate, regular rhythm and normal heart sounds.   Pulmonary/Chest: Effort normal and breath sounds normal. No respiratory distress. She has no wheezes.  Abdominal: Soft. Bowel sounds are normal. There is no tenderness. There is no rigidity, no guarding and no CVA tenderness.    Musculoskeletal: Normal range of motion.  Neurological: She is alert and oriented to person, place, and time.  Skin: Skin is warm and dry. No rash noted.  Psychiatric: She has a normal mood and affect.  Nursing note and vitals reviewed.   ED Course  .Foreign Body Removal Date/Time: 08/09/2015 8:18 PM Performed by: Garlon HatchetSANDERS, Alesandra Smart M Authorized by: Garlon HatchetSANDERS, Ailee Pates M Consent: Verbal consent obtained. Risks and benefits: risks, benefits and alternatives were discussed Consent given by: patient Patient understanding: patient states understanding of the procedure being performed Required items: required blood products, implants, devices, and special equipment available Patient identity confirmed: verbally with patient Body area: skin General location: trunk Location details: abdomen Patient sedated: no Patient restrained: no Localization method: visualized Removal mechanism: forceps Dressing: dressing applied Tendon involvement: none Complexity: simple  1 objects recovered. Objects recovered: tick Post-procedure assessment: foreign body removed Patient tolerance: Patient tolerated the procedure well with no immediate complications   (including critical care time)  DIAGNOSTIC STUDIES: Oxygen Saturation is 100% on RA, normal by my interpretation.    COORDINATION OF  CARE: 8:13 PM-Discussed treatment plan which includes Rx Doxycycline with pt at bedside and pt agreed to plan.   Labs Review Labs Reviewed - No data to display  Imaging Review No results found.    EKG Interpretation None      MDM   Final diagnoses:  Tick bite of abdomen, initial encounter   45 y.o. F here with tick to left lower abdomen x 3 days.  Patient afebrile, non-toxic.  Tick removed fully intact without difficulty, patient tolerated well.  No current clinical signs/sx of tick borne illness, however given that tick was attached for several days, will start on doxycycline for prophylaxis.  Discussed plan with patient, he/she acknowledged understanding and agreed with plan of care.  Return precautions given for new or worsening symptoms.  I personally performed the services described in this documentation, which was scribed in my presence. The recorded information has been reviewed and is accurate.  Garlon Hatchet, PA-C 08/09/15 2024

## 2015-08-09 NOTE — ED Notes (Signed)
Tick covered with occlusive dressing and alcohol. After a short time, tick removed in circular fashion, removed with head intact, meat attached to head area. Pt tolerated well.

## 2015-08-09 NOTE — ED Notes (Addendum)
Pt daughter pt c/o painful "pimple" to L abdomen x 3 days. Tick noted to be attached at this site. Large red circle noted around tick

## 2015-08-09 NOTE — Discharge Instructions (Signed)
Take the prescribed medication as directed-- this serves as treatment for any tick borne illness (such as Lyme or rocky mountain spotted fever). Monitor for increasing redness, swelling, drainage, high fever, or bodily rash. Return to the ED for new or worsening symptoms.

## 2015-10-19 NOTE — ED Provider Notes (Signed)
Medical screening examination/treatment/procedure(s) were performed by non-physician practitioner and as supervising physician I was immediately available for consultation/collaboration.   EKG Interpretation None       Jacalyn LefevreJulie Yatzary Merriweather, MD 10/19/15 [phone]

## 2016-01-09 ENCOUNTER — Ambulatory Visit (INDEPENDENT_AMBULATORY_CARE_PROVIDER_SITE_OTHER): Payer: BLUE CROSS/BLUE SHIELD | Admitting: Family Medicine

## 2016-01-09 VITALS — BP 130/80 | HR 65 | Temp 98.5°F | Resp 17 | Ht 61.5 in | Wt 184.0 lb

## 2016-01-09 DIAGNOSIS — H66001 Acute suppurative otitis media without spontaneous rupture of ear drum, right ear: Secondary | ICD-10-CM

## 2016-01-09 MED ORDER — AMOXICILLIN-POT CLAVULANATE 875-125 MG PO TABS: 1.0000 | ORAL_TABLET | Freq: Two times a day (BID) | ORAL | 0 refills | Status: DC

## 2016-01-09 MED ORDER — HYDROCODONE-ACETAMINOPHEN 7.5-325 MG/15ML PO SOLN: 5.0000 mL | ORAL | 0 refills | Status: DC | PRN

## 2016-01-09 NOTE — Patient Instructions (Addendum)
     IF you received an x-ray today, you will receive an invoice from [facility]. Please contact [facility] at [phone] with questions or concerns regarding your invoice.   IF you received labwork today, you will receive an invoice from [facility]. Please contact [facility] at [phone] with questions or concerns regarding your invoice.   Our billing staff will not be able to assist you with questions regarding bills from these companies.  You will be contacted with the lab results as soon as they are available. The fastest way to get your results is to activate your My Chart account. Instructions are located on the last page of this paperwork. If you have not heard from us regarding the results in 2 weeks, please contact this office.     Otitis Media, Adult Otitis media is redness, soreness, and inflammation of the middle ear. Otitis media may be caused by allergies or, most commonly, by infection. Often it occurs as a complication of the common cold. SIGNS AND SYMPTOMS Symptoms of otitis media may include:  Earache.  Fever.  Ringing in your ear.  Headache.  Leakage of fluid from the ear. DIAGNOSIS To diagnose otitis media, your health care provider will examine your ear with an otoscope. This is an instrument that allows your health care provider to see into your ear in order to examine your eardrum. Your health care provider also will ask you questions about your symptoms. TREATMENT  Typically, otitis media resolves on its own within 3-5 days. Your health care provider may prescribe medicine to ease your symptoms of pain. If otitis media does not resolve within 5 days or is recurrent, your health care provider may prescribe antibiotic medicines if he or she suspects that a bacterial infection is the cause. HOME CARE INSTRUCTIONS   If you were prescribed an antibiotic medicine, finish it all even if you start to feel  better.  Take medicines only as directed by your health care provider.  Keep all follow-up visits as directed by your health care provider. SEEK MEDICAL CARE IF:  You have otitis media only in one ear, or bleeding from your nose, or both.  You notice a lump on your neck.  You are not getting better in 3-5 days.  You feel worse instead of better. SEEK IMMEDIATE MEDICAL CARE IF:   You have pain that is not controlled with medicine.  You have swelling, redness, or pain around your ear or stiffness in your neck.  You notice that part of your face is paralyzed.  You notice that the bone behind your ear (mastoid) is tender when you touch it. MAKE SURE YOU:   Understand these instructions.  Will watch your condition.  Will get help right away if you are not doing well or get worse.   This information is not intended to replace advice given to you by your health care provider. Make sure you discuss any questions you have with your health care provider.   Document Released: 12/29/2003 Document Revised: 04/15/2014 Document Reviewed: 10/20/2012 Elsevier Interactive Patient Education 2016 Elsevier Inc.  

## 2016-01-09 NOTE — Progress Notes (Signed)
Subjective:    Patient ID: [name], female    DOB: 05-24-1970, 45 y.o.   MRN: [id number]   Chief Complaint  Patient presents with  . Ear Pain    right   . Cough    HPI   Has had several days of a URI type illness and chills at night. Not using any otc medicines. Her right ear starting bothering her more yesterday aftef she tried cleaning.    No past medical history on file. Current Outpatient Prescriptions on File Prior to Visit  Medication Sig Dispense Refill  . FLUoxetine (PROZAC) 20 MG capsule Take 1 capsule (20 mg total) by mouth daily. Return to clinic in May 2017. (Patient not taking: Reported on 01/09/2016) 90 capsule 0   No current facility-administered medications on file prior to visit.    No Known Allergies   Review of Systems  Constitutional: Positive for fatigue. Negative for activity change, chills, diaphoresis and fever.  HENT: Positive for congestion, ear pain, postnasal drip, rhinorrhea and sore throat. Negative for ear discharge, hearing loss, nosebleeds, sinus pressure, trouble swallowing and voice change.   Eyes: Negative for discharge and itching.  Respiratory: Negative for cough and shortness of breath.   Cardiovascular: Negative for chest pain.  Gastrointestinal: Negative for nausea and vomiting.  Skin: Negative for rash.  Neurological: Negative for dizziness and syncope.  Hematological: Positive for adenopathy.  Psychiatric/Behavioral: Positive for sleep disturbance.       Objective:   Physical Exam  Constitutional: She is oriented to person, place, and time. She appears well-developed and well-nourished. No distress.  HENT:  Head: Normocephalic and atraumatic.  Right Ear: External ear and ear canal normal. Tympanic membrane is injected. A middle ear effusion is present.  Left Ear: External ear and ear canal normal. Tympanic membrane is erythematous and bulging. A middle ear effusion is present.  Nose: Mucosal edema present. No rhinorrhea.    Mouth/Throat: Uvula is midline and mucous membranes are normal. Posterior oropharyngeal erythema present. No oropharyngeal exudate, posterior oropharyngeal edema or tonsillar abscesses.  Eyes: Conjunctivae are normal. Right eye exhibits no discharge. Left eye exhibits no discharge. No scleral icterus.  Neck: Normal range of motion. Neck supple.  Cardiovascular: Normal rate, regular rhythm, normal heart sounds and intact distal pulses.   Pulmonary/Chest: Effort normal and breath sounds normal.  Lymphadenopathy:       Head (right side): No submandibular, no preauricular and no posterior auricular adenopathy present.       Head (left side): No submandibular, no preauricular and no posterior auricular adenopathy present.    She has no cervical adenopathy.       Right: No supraclavicular adenopathy present.       Left: No supraclavicular adenopathy present.  Neurological: She is alert and oriented to person, place, and time.  Skin: Skin is warm and dry. She is not diaphoretic. No erythema.  Psychiatric: She has a normal mood and affect. Her behavior is normal.    BP 130/80 (BP Location: Right Arm, Patient Position: Sitting, Cuff Size: Normal)   Pulse 65   Temp 98.5 F (36.9 C) (Oral)   Resp 17   Ht 5' 1.5" (1.562 m)   Wt 184 lb (83.5 kg)   SpO2 99%   BMI 34.20 kg/m        Assessment & Plan:   1. Acute suppurative otitis media of right ear without spontaneous rupture of tympanic membrane, recurrence not specified     Meds ordered this encounter  Medications  . amoxicillin-clavulanate (AUGMENTIN) 875-125 MG tablet    Sig: Take 1 tablet by mouth 2 (two) times daily.    Dispense:  20 tablet    Refill:  0  . HYDROcodone-acetaminophen (HYCET) 7.5-325 mg/15 ml solution    Sig: Take 5-10 mLs by mouth every 4 (four) hours as needed for moderate pain.    Dispense:  140 mL    Refill:  0      Norberto SorensonEva Shaw, M.D.  [facility]  [place] [address] [place] [id number] [phone] phone [phone] fax  01/11/16 9:35 PM

## 2017-11-15 ENCOUNTER — Encounter: Payer: Self-pay | Admitting: Emergency Medicine

## 2017-11-15 ENCOUNTER — Emergency Department (HOSPITAL_COMMUNITY)
Admission: EM | Admit: 2017-11-15 | Discharge: 2017-11-16 | Disposition: A | Discharge status: Home / Self Care | Payer: BLUE CROSS/BLUE SHIELD | Attending: Emergency Medicine | Admitting: Emergency Medicine

## 2017-11-15 ENCOUNTER — Other Ambulatory Visit: Payer: Self-pay

## 2017-11-15 DIAGNOSIS — T7840XA Allergy, unspecified, initial encounter: Secondary | ICD-10-CM | POA: Diagnosis not present

## 2017-11-15 DIAGNOSIS — R2231 Localized swelling, mass and lump, right upper limb: Secondary | ICD-10-CM | POA: Diagnosis present

## 2017-11-15 NOTE — ED Triage Notes (Signed)
Pt reports she had henna tattoo placed yesterday, now having swelling, redness and blistering to the right arm. Feels tightness in the left hand where the tattoo was placed aware. Itchiness to bilateral feet. No meds PTA for the same.

## 2017-11-16 MED ORDER — PREDNISONE 20 MG PO TABS
60.0000 mg | ORAL_TABLET | Freq: Once | ORAL | Status: AC
  Administered 2017-11-16: 60 mg via ORAL
  Filled 2017-11-16: qty 3

## 2017-11-16 MED ORDER — DIPHENHYDRAMINE HCL 25 MG PO CAPS
25.0000 mg | ORAL_CAPSULE | Freq: Once | ORAL | Status: AC
  Administered 2017-11-16: 25 mg via ORAL
  Filled 2017-11-16: qty 1

## 2017-11-16 MED ORDER — PREDNISONE 20 MG PO TABS: 40.0000 mg | ORAL_TABLET | Freq: Every day | ORAL | 0 refills | Status: AC

## 2017-11-16 MED ORDER — FAMOTIDINE 20 MG PO TABS
20.0000 mg | ORAL_TABLET | Freq: Once | ORAL | Status: AC
  Administered 2017-11-16: 20 mg via ORAL
  Filled 2017-11-16: qty 1

## 2017-11-16 MED ORDER — DIPHENHYDRAMINE HCL 25 MG PO TABS: 25.0000 mg | ORAL_TABLET | Freq: Four times a day (QID) | ORAL | 0 refills | Status: DC

## 2017-11-16 MED ORDER — FAMOTIDINE 20 MG PO TABS: 20.0000 mg | ORAL_TABLET | Freq: Two times a day (BID) | ORAL | 0 refills | Status: DC

## 2017-11-16 NOTE — ED Provider Notes (Signed)
[name]-EMERGENCY DEPT Provider Note   CSN: [id number] Arrival date & time: 11/15/17  2306     History   Chief Complaint Chief Complaint  Patient presents with  . Allergic Reaction    HPI [name] is a 47 y.o. female.  HPI 47 year old female who underwent placement of a henna tattoo to her bilateral hands and feet yesterday presents the emergency department with developing redness swelling and warmth to all 4 areas.  She had this happen with a prior episode but was not as pronounced.  She is currently breast-feeding and she was unsure of which medication she could take.  She presents here with mild symptoms.   History reviewed. No pertinent past medical history.  There are no active problems to display for this patient.   Past Surgical History:  Procedure Laterality Date  . NEPHRECTOMY       OB History   None      Home Medications    Prior to Admission medications   Medication Sig Start Date End Date Taking? Authorizing Provider  diphenhydrAMINE (BENADRYL) 25 MG tablet Take 1 tablet (25 mg total) by mouth every 6 (six) hours. 11/16/17   Azalia Bilis, MD  famotidine (PEPCID) 20 MG tablet Take 1 tablet (20 mg total) by mouth 2 (two) times daily. 11/16/17   Azalia Bilis, MD  predniSONE (DELTASONE) 20 MG tablet Take 2 tablets (40 mg total) by mouth daily for 3 days. 11/17/17 11/20/17  Azalia Bilis, MD    Family History Family History  Problem Relation Age of Onset  . Diabetes Mother     Social History Social History   Tobacco Use  . Smoking status: Never Smoker  Substance Use Topics  . Alcohol use: No  . Drug use: No     Allergies   Patient has no known allergies.   Review of Systems Review of Systems  All other systems reviewed and are negative.    Physical Exam Updated Vital Signs BP 131/75 (BP Location: Left Arm)   Pulse 93   Temp 98.4 F (36.9 C) (Oral)   Resp 14   Ht 5\' 2"  (1.575 m)   Wt 85.3 kg   LMP  10/19/2017   SpO2 100%   BMI 34.39 kg/m   Physical Exam  Constitutional: She is oriented to person, place, and time. She appears well-developed and well-nourished.  HENT:  Head: Normocephalic.  Eyes: EOM are normal.  Neck: Normal range of motion.  Pulmonary/Chest: Effort normal.  Abdominal: She exhibits no distension.  Musculoskeletal: Normal range of motion.  Henna tattoo to her bilateral hands and bilateral ankles with some warmth swelling and erythema consistent with localized allergic reaction  Neurological: She is alert and oriented to person, place, and time.  Psychiatric: She has a normal mood and affect.  Nursing note and vitals reviewed.    ED Treatments / Results  Labs (all labs ordered are listed, but only abnormal results are displayed) Labs Reviewed - No data to display  EKG None  Radiology No results found.  Procedures Procedures (including critical care time)  Medications Ordered in ED Medications  diphenhydrAMINE (BENADRYL) capsule 25 mg (has no administration in time range)  famotidine (PEPCID) tablet 20 mg (has no administration in time range)  predniSONE (DELTASONE) tablet 60 mg (has no administration in time range)     Initial Impression / Assessment and Plan / ED Course  I have reviewed the triage vital signs and the nursing notes.  Pertinent labs &  imaging results that were available during my care of the patient were reviewed by me and considered in my medical decision making (see chart for details).     Localized allergic reaction.  No signs to suggest infection.  Benadryl, Pepcid, prednisone recommended.  Ice as needed.  Topical 1% hydrocortisone cream to a focal spot if needed.  Final Clinical Impressions(s) / ED Diagnoses   Final diagnoses:  Allergic reaction, initial encounter    ED Discharge Orders         Ordered    predniSONE (DELTASONE) 20 MG tablet  Daily     11/16/17 0044    diphenhydrAMINE (BENADRYL) 25 MG tablet  Every  6 hours     11/16/17 0044    famotidine (PEPCID) 20 MG tablet  2 times daily     11/16/17 0044           Azalia Bilisampos, Una Yeomans, MD 11/16/17 [phone]

## 2018-06-22 DIAGNOSIS — M25561 Pain in right knee: Secondary | ICD-10-CM | POA: Diagnosis not present

## 2018-11-07 ENCOUNTER — Emergency Department (HOSPITAL_COMMUNITY): Payer: BLUE CROSS/BLUE SHIELD

## 2018-11-07 ENCOUNTER — Other Ambulatory Visit: Payer: Self-pay

## 2018-11-07 ENCOUNTER — Encounter (HOSPITAL_COMMUNITY): Payer: Self-pay | Admitting: Emergency Medicine

## 2018-11-07 ENCOUNTER — Emergency Department (HOSPITAL_COMMUNITY)
Admission: EM | Admit: 2018-11-07 | Discharge: 2018-11-07 | Disposition: A | Discharge status: Home / Self Care | Payer: BLUE CROSS/BLUE SHIELD | Attending: Emergency Medicine | Admitting: Emergency Medicine

## 2018-11-07 DIAGNOSIS — R1110 Vomiting, unspecified: Secondary | ICD-10-CM | POA: Diagnosis not present

## 2018-11-07 DIAGNOSIS — R251 Tremor, unspecified: Secondary | ICD-10-CM | POA: Insufficient documentation

## 2018-11-07 DIAGNOSIS — M62831 Muscle spasm of calf: Secondary | ICD-10-CM | POA: Diagnosis not present

## 2018-11-07 DIAGNOSIS — M62838 Other muscle spasm: Secondary | ICD-10-CM | POA: Diagnosis not present

## 2018-11-07 DIAGNOSIS — R0789 Other chest pain: Secondary | ICD-10-CM | POA: Insufficient documentation

## 2018-11-07 DIAGNOSIS — R079 Chest pain, unspecified: Secondary | ICD-10-CM | POA: Diagnosis not present

## 2018-11-07 DIAGNOSIS — Z79899 Other long term (current) drug therapy: Secondary | ICD-10-CM | POA: Insufficient documentation

## 2018-11-07 LAB — CBC WITH DIFFERENTIAL/PLATELET
Abs Immature Granulocytes: 0.02 10*3/uL (ref 0.00–0.07)
Basophils Absolute: 0 10*3/uL (ref 0.0–0.1)
Basophils Relative: 1 %
Eosinophils Absolute: 0 10*3/uL (ref 0.0–0.5)
Eosinophils Relative: 1 %
HCT: 39.5 % (ref 36.0–46.0)
Hemoglobin: 12.7 g/dL (ref 12.0–15.0)
Immature Granulocytes: 0 %
Lymphocytes Relative: 40 %
Lymphs Abs: 2.3 10*3/uL (ref 0.7–4.0)
MCH: 28.7 pg (ref 26.0–34.0)
MCHC: 32.2 g/dL (ref 30.0–36.0)
MCV: 89.4 fL (ref 80.0–100.0)
Monocytes Absolute: 0.5 10*3/uL (ref 0.1–1.0)
Monocytes Relative: 8 %
Neutro Abs: 2.9 10*3/uL (ref 1.7–7.7)
Neutrophils Relative %: 50 %
Platelets: 257 10*3/uL (ref 150–400)
RBC: 4.42 MIL/uL (ref 3.87–5.11)
RDW: 12.1 % (ref 11.5–15.5)
WBC: 5.8 10*3/uL (ref 4.0–10.5)
nRBC: 0 % (ref 0.0–0.2)

## 2018-11-07 LAB — COMPREHENSIVE METABOLIC PANEL
ALT: 22 U/L (ref 0–44)
AST: 24 U/L (ref 15–41)
Albumin: 4 g/dL (ref 3.5–5.0)
Alkaline Phosphatase: 68 U/L (ref 38–126)
Anion gap: 11 (ref 5–15)
BUN: 13 mg/dL (ref 6–20)
CO2: 22 mmol/L (ref 22–32)
Calcium: 9.5 mg/dL (ref 8.9–10.3)
Chloride: 103 mmol/L (ref 98–111)
Creatinine, Ser: 0.78 mg/dL (ref 0.44–1.00)
GFR calc Af Amer: >60 mL/min (ref >60)
GFR calc non Af Amer: >60 mL/min (ref >60)
Glucose, Bld: 84 mg/dL (ref 70–99)
Potassium: 3.7 mmol/L (ref 3.5–5.1)
Sodium: 136 mmol/L (ref 135–145)
Total Bilirubin: 0.5 mg/dL (ref 0.3–1.2)
Total Protein: 7.9 g/dL (ref 6.5–8.1)

## 2018-11-07 LAB — TROPONIN I (HIGH SENSITIVITY): Troponin I (High Sensitivity): 4 ng/L (ref <18)

## 2018-11-07 LAB — I-STAT BETA HCG BLOOD, ED (MC, WL, AP ONLY): I-stat hCG, quantitative: <5 m[IU]/mL (ref <5)

## 2018-11-07 LAB — LACTIC ACID, PLASMA: Lactic Acid, Venous: 0.8 mmol/L (ref 0.5–1.9)

## 2018-11-07 LAB — CBG MONITORING, ED: Glucose-Capillary: 81 mg/dL (ref 70–99)

## 2018-11-07 NOTE — ED Notes (Signed)
TWO UNSUCCESSFUL LAB COLLECTION ATTEMPTS 

## 2018-11-07 NOTE — ED Notes (Signed)
Pt was verbalized discharge instructions. Pt had no further questions at this time. NAD. 

## 2018-11-07 NOTE — ED Notes (Signed)
Attempted to straight stick patient for labs and patient declined stating "I'm ready to go home". Explained importance of repeat test to patient, EDP made aware of refusal.

## 2018-11-07 NOTE — ED Notes (Signed)
Pt's daughter, who found pt and witnessed her behavior, waiting in car to be let back to sit with pt.  Her name is Robb Matar and she can be contacted at [phone].

## 2018-11-07 NOTE — ED Provider Notes (Signed)
[facility] Provider Note   CSN: [id number] Arrival date & time: 11/07/18  1626    History   Chief Complaint Chief Complaint  Patient presents with  . Shaking    HPI [name] is a 48 y.o. female.     Patient is a 48 year old female who was brought in by her daughter for some muscle spasms.  Patient states that she was not feeling well earlier today.  She has been just feeling real tired.  She had an episode of vomiting this morning.  No known fevers.  No cough or cold symptoms.  She did have some tightness in her chest.  She became very emotional because her grandchildren were fighting.  Following this she started having some muscle spasms intermittently in her arms and legs.  There is no seizure type activity.  She has been fully awake and alert during these episodes.  No incontinence.  She feels like it may be stress related.  She denies any shortness of breath.  She is feeling little bit better now.  She denies any history of similar symptoms.  She says that she does have a headache but she has frequent headaches and this is not atypical.     History reviewed. No pertinent past medical history.  There are no active problems to display for this patient.   Past Surgical History:  Procedure Laterality Date  . NEPHRECTOMY       OB History   No obstetric history on file.      Home Medications    Prior to Admission medications   Medication Sig Start Date End Date Taking? Authorizing Provider  diphenhydrAMINE (BENADRYL) 25 MG tablet Take 1 tablet (25 mg total) by mouth every 6 (six) hours. 11/16/17   Azalia Bilisampos, Kevin, MD  famotidine (PEPCID) 20 MG tablet Take 1 tablet (20 mg total) by mouth 2 (two) times daily. 11/16/17   Azalia Bilisampos, Kevin, MD    Family History Family History  Problem Relation Age of Onset  . Diabetes Mother     Social History Social History   Tobacco Use  . Smoking status: Never Smoker  Substance Use Topics  .  Alcohol use: No  . Drug use: No     Allergies   Patient has no known allergies.   Review of Systems Review of Systems  Constitutional: Positive for fatigue. Negative for chills, diaphoresis and fever.  HENT: Negative for congestion, rhinorrhea and sneezing.   Eyes: Negative.   Respiratory: Negative for cough, chest tightness and shortness of breath.   Cardiovascular: Negative for chest pain and leg swelling.  Gastrointestinal: Positive for vomiting. Negative for abdominal pain, blood in stool, diarrhea and nausea.  Genitourinary: Negative for difficulty urinating, flank pain, frequency and hematuria.  Musculoskeletal: Negative for arthralgias and back pain.       Muscle spasms  Skin: Negative for rash.  Neurological: Positive for headaches. Negative for dizziness, speech difficulty, weakness and numbness.     Physical Exam Updated Vital Signs BP (!) 152/79   Pulse 65   Temp 98.1 F (36.7 C) (Oral)   Resp 18   SpO2 98%   Physical Exam Constitutional:      Appearance: She is well-developed.  HENT:     Head: Normocephalic and atraumatic.  Eyes:     Pupils: Pupils are equal, round, and reactive to light.  Neck:     Musculoskeletal: Normal range of motion and neck supple.  Cardiovascular:     Rate and  Rhythm: Normal rate and regular rhythm.     Heart sounds: Normal heart sounds.  Pulmonary:     Effort: Pulmonary effort is normal. No respiratory distress.     Breath sounds: Normal breath sounds. No wheezing or rales.  Chest:     Chest wall: No tenderness.  Abdominal:     General: Bowel sounds are normal.     Palpations: Abdomen is soft.     Tenderness: There is no abdominal tenderness. There is no guarding or rebound.  Musculoskeletal: Normal range of motion.  Lymphadenopathy:     Cervical: No cervical adenopathy.  Skin:    General: Skin is warm and dry.     Findings: No rash.  Neurological:     Mental Status: She is alert and oriented to person, place, and  time.     Comments: Motor 5/5 all extremities Sensation grossly intact to LT all extremities Finger to Nose intact, no pronator drift CN II-XII grossly intact        ED Treatments / Results  Labs (all labs ordered are listed, but only abnormal results are displayed) Labs Reviewed  COMPREHENSIVE METABOLIC PANEL  CBC WITH DIFFERENTIAL/PLATELET  LACTIC ACID, PLASMA  URINALYSIS, ROUTINE W REFLEX MICROSCOPIC  CBG MONITORING, ED  I-STAT BETA HCG BLOOD, ED ([facility], [facility], [facility] ONLY)  TROPONIN I (HIGH SENSITIVITY)  TROPONIN I (HIGH SENSITIVITY)    EKG EKG Interpretation  Date/Time:  Saturday November 07 2018 16:38:22 EDT Ventricular Rate:  69 PR Interval:    QRS Duration: 99 QT Interval:  391 QTC Calculation: 419 R Axis:   45 Text Interpretation:  Sinus rhythm Baseline wander in lead(s) I III aVL since last tracing no significant change Confirmed by Malvin Johns [phone]) on 11/07/2018 5:06:13 PM   Radiology Ct Head Wo Contrast  Result Date: 11/07/2018 CLINICAL DATA:  Uncontrolled shaking approximately 30 minutes prior to arrival. EXAM: CT HEAD WITHOUT CONTRAST TECHNIQUE: Contiguous axial images were obtained from the base of the skull through the vertex without intravenous contrast. COMPARISON:  03/23/2008 FINDINGS: Brain: No evidence of acute infarction, hemorrhage, hydrocephalus, extra-axial collection or mass lesion/mass effect. Vascular: No hyperdense vessel or unexpected calcification. Skull: Normal. Negative for fracture or focal lesion. Sinuses/Orbits: Globes and orbits are unremarkable. Visualized sinuses and mastoid air cells are clear. Other: None. IMPRESSION: Normal unenhanced CT scan of the brain. Electronically Signed   By: Lajean Manes M.D.   On: 11/07/2018 17:39    Procedures Procedures (including critical care time)  Medications Ordered in ED Medications - No data to display   Initial Impression / Assessment and Plan / ED Course  I have reviewed the triage vital signs  and the nursing notes.  Pertinent labs & imaging results that were available during my care of the patient were reviewed by me and considered in my medical decision making (see chart for details).        Patient is a 48 year old female who presents after she started having some muscle spasms in her extremities.  While witnessing these, she is tensing up and extremity.  I am able to make her relax when I move it and she does not seem to be have any seizure type activity or twitching.  No tremors.  Her labs are non-concerning.  Her head CT is negative.  She had some associated chest pain.  Her EKG does not show any ischemic changes.  Her troponin is negative.  She is refusing to stay for second troponin.  She is refusing to  stay for urinalysis.  She is currently asymptomatic.  She feels like it was due to a stress reaction as she got very emotional when her children were fighting.  She is ready to go home and declines any further evaluation.  She was encouraged to follow-up with her PCP.  Return precautions were given.  Final Clinical Impressions(s) / ED Diagnoses   Final diagnoses:  Chest pain, unspecified type  Muscle spasms of both lower extremities    ED Discharge Orders    None       Rolan BuccoBelfi, Jasimine Simms, MD 11/07/18 2149

## 2018-11-07 NOTE — ED Triage Notes (Signed)
Patient reports PTA she experienced approximately thirty minutes of uncontrollable shaking. Denies complaints at this time.

## 2018-11-07 NOTE — ED Notes (Signed)
Family at bedside. 

## 2018-11-07 NOTE — ED Notes (Signed)
I have had difficulty in obtaining blood/IV. Our C.N., Manuela Schwartz will attempt u/s-guided IV shortly. Pt. Has a visitor and is in no distress.

## 2019-04-20 DIAGNOSIS — Z1231 Encounter for screening mammogram for malignant neoplasm of breast: Secondary | ICD-10-CM | POA: Diagnosis not present

## 2019-04-20 DIAGNOSIS — R946 Abnormal results of thyroid function studies: Secondary | ICD-10-CM | POA: Diagnosis not present

## 2019-04-20 DIAGNOSIS — Z0000 Encounter for general adult medical examination without abnormal findings: Secondary | ICD-10-CM | POA: Diagnosis not present

## 2019-04-20 DIAGNOSIS — Z283 Underimmunization status: Secondary | ICD-10-CM | POA: Diagnosis not present

## 2019-04-20 DIAGNOSIS — Z23 Encounter for immunization: Secondary | ICD-10-CM | POA: Diagnosis not present

## 2019-04-28 DIAGNOSIS — D72819 Decreased white blood cell count, unspecified: Secondary | ICD-10-CM | POA: Diagnosis not present

## 2019-06-21 ENCOUNTER — Emergency Department (HOSPITAL_COMMUNITY): Payer: BC Managed Care – PPO

## 2019-06-21 ENCOUNTER — Encounter (HOSPITAL_COMMUNITY): Payer: Self-pay

## 2019-06-21 ENCOUNTER — Emergency Department (HOSPITAL_COMMUNITY)
Admission: EM | Admit: 2019-06-21 | Discharge: 2019-06-21 | Disposition: A | Discharge status: Home / Self Care | Payer: BC Managed Care – PPO | Attending: Emergency Medicine | Admitting: Emergency Medicine

## 2019-06-21 ENCOUNTER — Other Ambulatory Visit: Payer: Self-pay

## 2019-06-21 DIAGNOSIS — I10 Essential (primary) hypertension: Secondary | ICD-10-CM | POA: Diagnosis not present

## 2019-06-21 DIAGNOSIS — R404 Transient alteration of awareness: Secondary | ICD-10-CM | POA: Diagnosis not present

## 2019-06-21 DIAGNOSIS — R569 Unspecified convulsions: Secondary | ICD-10-CM | POA: Diagnosis not present

## 2019-06-21 LAB — CBC
HCT: 44.2 % (ref 36.0–46.0)
Hemoglobin: 13.8 g/dL (ref 12.0–15.0)
MCH: 28.9 pg (ref 26.0–34.0)
MCHC: 31.2 g/dL (ref 30.0–36.0)
MCV: 92.5 fL (ref 80.0–100.0)
Platelets: 246 10*3/uL (ref 150–400)
RBC: 4.78 MIL/uL (ref 3.87–5.11)
RDW: 12.1 % (ref 11.5–15.5)
WBC: 5 10*3/uL (ref 4.0–10.5)
nRBC: 0 % (ref 0.0–0.2)

## 2019-06-21 LAB — BASIC METABOLIC PANEL
Anion gap: 12 (ref 5–15)
BUN: 11 mg/dL (ref 6–20)
CO2: 20 mmol/L — ABNORMAL LOW (ref 22–32)
Calcium: 9.5 mg/dL (ref 8.9–10.3)
Chloride: 106 mmol/L (ref 98–111)
Creatinine, Ser: 0.87 mg/dL (ref 0.44–1.00)
GFR calc Af Amer: >60 mL/min (ref >60)
GFR calc non Af Amer: >60 mL/min (ref >60)
Glucose, Bld: 99 mg/dL (ref 70–99)
Potassium: 3.4 mmol/L — ABNORMAL LOW (ref 3.5–5.1)
Sodium: 138 mmol/L (ref 135–145)

## 2019-06-21 LAB — I-STAT BETA HCG BLOOD, ED (MC, WL, AP ONLY): I-stat hCG, quantitative: <5 m[IU]/mL (ref <5)

## 2019-06-21 NOTE — ED Triage Notes (Signed)
Pt bib ems from her work for new onset seizure. Pt was sitting at desk when a co worker saw her slump over in her chair and have seizure like activity. Pt was helped to the floor and when EMS arrived pt was postictal, alert to verbal stimulus. When pt arrived to ED she is alert, oriented, talking on the phone. VSS

## 2019-06-21 NOTE — Discharge Instructions (Addendum)
Do not drive until you follow up with neurologist

## 2019-06-21 NOTE — ED Provider Notes (Signed)
[facility] DEPARTMENT Provider Note   CSN: [id number] Arrival date & time: 06/21/19  1526     History Chief Complaint  Patient presents with  . Seizures    [name] is a 49 y.o. female.  HPI Patient is a 49 year old female with no significant past medical history presenting to the ED today after a seizure.  Patient denies any history of seizure-like activity.  She says that she was at work when she was told that she collapsed.  The next thing that she recalls was riding to the ED in an ambulance.  Per EMS, patient had a 1 minute long tonic-clonic seizure.  On their arrival, patient was postictal.  No medications were administered.  Patient denies any recent falls or injuries.  She denies any infectious symptoms including fever, chills, nausea, vomiting or diarrhea.  She takes no medications and denies alcohol and drug use.  She only drinks 1 cup of coffee a day.  Patient thinks that the seizure may have been onset by stress as she is currently caring for her husband who has leukemia.    History reviewed. No pertinent past medical history.  There are no problems to display for this patient.   Past Surgical History:  Procedure Laterality Date  . NEPHRECTOMY       OB History   No obstetric history on file.     Family History  Problem Relation Age of Onset  . Diabetes Mother     Social History   Tobacco Use  . Smoking status: Never Smoker  Substance Use Topics  . Alcohol use: No  . Drug use: No    Home Medications Prior to Admission medications   Medication Sig Start Date End Date Taking? Authorizing Provider  diphenhydrAMINE (BENADRYL) 25 MG tablet Take 1 tablet (25 mg total) by mouth every 6 (six) hours. 11/16/17   Jola Schmidt, MD  famotidine (PEPCID) 20 MG tablet Take 1 tablet (20 mg total) by mouth 2 (two) times daily. 11/16/17   Jola Schmidt, MD    Allergies    Patient has no known allergies.  Review of Systems   Review  of Systems  Constitutional: Negative for chills and fever.  HENT: Negative for rhinorrhea and sore throat.   Eyes: Negative for pain and visual disturbance.  Respiratory: Negative for cough and shortness of breath.   Cardiovascular: Negative for chest pain and palpitations.  Gastrointestinal: Negative for abdominal pain, diarrhea, nausea and vomiting.  Genitourinary: Negative for dysuria and hematuria.  Musculoskeletal: Negative for arthralgias and back pain.  Skin: Negative for color change and rash.  Neurological: Positive for seizures. Negative for syncope, weakness and headaches.  Psychiatric/Behavioral: Negative for agitation.  All other systems reviewed and are negative.   Physical Exam Updated Vital Signs BP (!) 153/97   Pulse 95   Temp 97.8 F (36.6 C) (Oral)   Resp 18   SpO2 100%   Physical Exam Vitals and nursing note reviewed.  Constitutional:      General: She is not in acute distress.    Appearance: She is well-developed.  HENT:     Head: Normocephalic and atraumatic.     Right Ear: External ear normal.     Left Ear: External ear normal.     Nose: Nose normal. No congestion or rhinorrhea.     Mouth/Throat:     Mouth: Mucous membranes are moist.     Pharynx: Oropharynx is clear.  Eyes:     Extraocular  Movements: Extraocular movements intact.     Pupils: Pupils are equal, round, and reactive to light.  Cardiovascular:     Rate and Rhythm: Normal rate and regular rhythm.     Pulses: Normal pulses.     Heart sounds: Normal heart sounds.  Pulmonary:     Effort: Pulmonary effort is normal. No respiratory distress.     Breath sounds: Normal breath sounds.  Abdominal:     General: There is no distension.     Palpations: Abdomen is soft.     Tenderness: There is no abdominal tenderness. There is no guarding or rebound.  Musculoskeletal:        General: Normal range of motion.     Cervical back: Normal range of motion and neck supple.     Right lower leg: No  edema.     Left lower leg: No edema.  Skin:    General: Skin is warm and dry.     Capillary Refill: Capillary refill takes less than 2 seconds.  Neurological:     General: No focal deficit present.     Mental Status: She is alert and oriented to person, place, and time. Mental status is at baseline.     Cranial Nerves: No cranial nerve deficit.     Sensory: No sensory deficit.     Motor: No weakness.     Coordination: Coordination normal.  Psychiatric:        Mood and Affect: Mood normal.     ED Results / Procedures / Treatments   Labs (all labs ordered are listed, but only abnormal results are displayed) Labs Reviewed  BASIC METABOLIC PANEL - Abnormal; Notable for the following components:      Result Value   Potassium 3.4 (*)    CO2 20 (*)    All other components within normal limits  CBC  I-STAT BETA HCG BLOOD, ED ([facility], [facility], [facility] ONLY)    EKG None  Radiology No results found.  Procedures Procedures (including critical care time)  Medications Ordered in ED Medications - No data to display  ED Course  I have reviewed the triage vital signs and the nursing notes.  Pertinent labs & imaging results that were available during my care of the patient were reviewed by me and considered in my medical decision making (see chart for details).    MDM Rules/Calculators/A&P                     Patient is a 49 year old female with no significant past medical history presenting to the ED today following a first-time seizure.  Physical exam unremarkable.  Vital signs stable.  Afebrile.  On arrival, patient appears generally well and is displaying no signs of acute distress.  EMS documented that she was initially postictal; however, on arrival, she is AAO x3 and back to baseline.  Lab work collected in triage. CBC and BMP unremarkable. Urine pregnancy test negative.  Low suspicion for traumatic or infectious etiologies of seizure.  Lab work not consistent with metabolic derangements  or hypoglycemia.  Collected CT head which showed no acute findings -low suspicion for new space-occupying lesion.  At this time, although unsure of exact etiology of seizure, no further work-up required while in the ED.  Encourage patient to follow-up with neurology in outpatient setting.  Provided contact information for neurology clinic.  Advised her not to drive until she sees neurology.  Patient expresses understanding and is in agreement with plan.  Provided strict  return precautions including repeat seizure.  Patient assessed and evaluated with Dr. Silverio Lay.  Delray Alt, MD   Final Clinical Impression(s) / ED Diagnoses Final diagnoses:  Seizure [place])    Rx / DC Orders ED Discharge Orders    None       Delray Alt, MD 06/21/19 2221    Charlynne Pander, MD 06/23/19 1025

## 2019-06-28 ENCOUNTER — Other Ambulatory Visit: Payer: Self-pay

## 2019-06-28 ENCOUNTER — Ambulatory Visit (INDEPENDENT_AMBULATORY_CARE_PROVIDER_SITE_OTHER): Payer: BC Managed Care – PPO | Admitting: Neurology

## 2019-06-28 ENCOUNTER — Encounter: Payer: Self-pay | Admitting: Neurology

## 2019-06-28 VITALS — BP 144/99 | HR 69 | Temp 98.2°F | Ht 59.0 in | Wt 177.0 lb

## 2019-06-28 DIAGNOSIS — R569 Unspecified convulsions: Secondary | ICD-10-CM

## 2019-06-28 MED ORDER — TRAZODONE HCL 50 MG PO TABS: 50.0000 mg | ORAL_TABLET | Freq: Every evening | ORAL | 1 refills | Status: DC | PRN

## 2019-06-28 NOTE — Patient Instructions (Addendum)
Place seizure patient instructions here. You are not supposed to drive or operate machinery for the next 6 month after a seizure. Takes showers, not bathing in a tub. Have a person with you when you swim.  You are working with sharp tools, make sure you you are seated and avoid flickering light.     Insomnia Insomnia is a sleep disorder that makes it difficult to fall asleep or stay asleep. Insomnia can cause fatigue, low energy, difficulty concentrating, mood swings, and poor performance at work or school. There are three different ways to classify insomnia:  Difficulty falling asleep.  Difficulty staying asleep.  Waking up too early in the morning. Any type of insomnia can be long-term (chronic) or short-term (acute). Both are common. Short-term insomnia usually lasts for three months or less. Chronic insomnia occurs at least three times a week for longer than three months. What are the causes? Insomnia may be caused by another condition, situation, or substance, such as:  Anxiety.  Certain medicines.  Gastroesophageal reflux disease (GERD) or other gastrointestinal conditions.  Asthma or other breathing conditions.  Restless legs syndrome, sleep apnea, or other sleep disorders.  Chronic pain.  Menopause.  Stroke.  Abuse of alcohol, tobacco, or illegal drugs.  Mental health conditions, such as depression.  Caffeine.  Neurological disorders, such as Alzheimer's disease.  An overactive thyroid (hyperthyroidism). Sometimes, the cause of insomnia may not be known. What increases the risk? Risk factors for insomnia include:  Gender. Women are affected more often than men.  Age. Insomnia is more common as you get older.  Stress.  Lack of exercise.  Irregular work schedule or working night shifts.  Traveling between different time zones.  Certain medical and mental health conditions. What are the signs or symptoms? If you have insomnia, the main symptom is  having trouble falling asleep or having trouble staying asleep. This may lead to other symptoms, such as:  Feeling fatigued or having low energy.  Feeling nervous about going to sleep.  Not feeling rested in the morning.  Having trouble concentrating.  Feeling irritable, anxious, or depressed. How is this diagnosed? This condition may be diagnosed based on:  Your symptoms and medical history. Your health care provider may ask about: ? Your sleep habits. ? Any medical conditions you have. ? Your mental health.  A physical exam. How is this treated? Treatment for insomnia depends on the cause. Treatment may focus on treating an underlying condition that is causing insomnia. Treatment may also include:  Medicines to help you sleep.  Counseling or therapy.  Lifestyle adjustments to help you sleep better. Follow these instructions at home: Eating and drinking   Limit or avoid alcohol, caffeinated beverages, and cigarettes, especially close to bedtime. These can disrupt your sleep.  Do not eat a large meal or eat spicy foods right before bedtime. This can lead to digestive discomfort that can make it hard for you to sleep. Sleep habits   Keep a sleep diary to help you and your health care provider figure out what could be causing your insomnia. Write down: ? When you sleep. ? When you wake up during the night. ? How well you sleep. ? How rested you feel the next day. ? Any side effects of medicines you are taking. ? What you eat and drink.  Make your bedroom a dark, comfortable place where it is easy to fall asleep. ? Put up shades or blackout curtains to block light from outside. ? Use a  white noise machine to block noise. ? Keep the temperature cool.  Limit screen use before bedtime. This includes: ? Watching TV. ? Using your smartphone, tablet, or computer.  Stick to a routine that includes going to bed and waking up at the same times every day and night. This can  help you fall asleep faster. Consider making a quiet activity, such as reading, part of your nighttime routine.  Try to avoid taking naps during the day so that you sleep better at night.  Get out of bed if you are still awake after 15 minutes of trying to sleep. Keep the lights down, but try reading or doing a quiet activity. When you feel sleepy, go back to bed. General instructions  Take over-the-counter and prescription medicines only as told by your health care provider.  Exercise regularly, as told by your health care provider. Avoid exercise starting several hours before bedtime.  Use relaxation techniques to manage stress. Ask your health care provider to suggest some techniques that may work well for you. These may include: ? Breathing exercises. ? Routines to release muscle tension. ? Visualizing peaceful scenes.  Make sure that you drive carefully. Avoid driving if you feel very sleepy.  Keep all follow-up visits as told by your health care provider. This is important. Contact a health care provider if:  You are tired throughout the day.  You have trouble in your daily routine due to sleepiness.  You continue to have sleep problems, or your sleep problems get worse. Get help right away if:  You have serious thoughts about hurting yourself or someone else. If you ever feel like you may hurt yourself or others, or have thoughts about taking your own life, get help right away. You can go to your nearest emergency department or call:  Your local emergency services (911 in the [place]  A suicide crisis helpline, such as the National Suicide Prevention Lifeline at 6397998190. This is open 24 hours a day. Summary  Insomnia is a sleep disorder that makes it difficult to fall asleep or stay asleep.  Insomnia can be long-term (chronic) or short-term (acute).  Treatment for insomnia depends on the cause. Treatment may focus on treating an underlying condition that is  causing insomnia.  Keep a sleep diary to help you and your health care provider figure out what could be causing your insomnia. This information is not intended to replace advice given to you by your health care provider. Make sure you discuss any questions you have with your health care provider. Document Revised: 03/07/2017 Document Reviewed: 01/02/2017 Elsevier Patient Education  2020 [employer].

## 2019-06-28 NOTE — Progress Notes (Signed)
Provider:  Melvyn Novas, MD  Primary Care Physician:  Virgina Norfolk, MD - [name]    Referring Provider: [facility], MD        Chief Complaint according to patient   Patient presents with:    . New Patient (Initial Visit)     pt with daughter, rm 54. pt states last monday she had a SZ at work. was found by friend passed out, shaking and foaming at mouth. if she had to guess the timing was roughly 10-15 min. went to ER. denies history of seizures. was not started on meds      HISTORY OF PRESENT ILLNESS:  [name] is a 49 year-old female patient seen here on 06/28/2019,  from ED.  Chief concern according to patient : " new onset seizure at work"    I have the pleasure of seeing Zane Samson today, a right-handed [place] female with a new onset seizure. She has a  has no past medical history on file.  There is no Family history of seizure disorders.  She reportedly felt normal until lunch time, when a friend at work told her she looked tired.  She was calling her sister on the phone and apparently had speech arrest. The patient cannot recall the event-  She was witnessed by a coworker, having fallen to the ground, from her seated position at the desk . She was convulsing for about 2 minutes and now breathing heavy, not responding. She almost appeared sleeping.  She bit her tongue. There was  urine and stool incontinence. Daughter arrived at the office.  An ambulance came and repeatedly seemed to drift in and out- normal speech -brought her to the ED  she already had recovered awareness.  Still was amnestic for the event. Had possible hit her head.  She called her daughter from the ED - spoke fluent.   The patient did not require stitches for the tongue bite, but she became more aware of soreness and headaches at the time she returned home.  All this took place on 21 June 2019 just last week head CT was obtained which was  unremarkable but also done without contrast.  No bleed.  All the patient could recall that she was at work and apparently was told that she had collapse but there was no memory of the details.  Patient may have ceased less than 2 minutes even but she did have significant amnesia.  She was discharged by Dr. Azalia Bilis .   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  The room is very bright.  She  lives in  a household with 6 persons, husband daughter, son in Social worker and grandchild, 2 sons.  Her husband has mental illness, bipolar. Psychotic , paranoid. , schizophrenia  The patient currently  used to work in shifts( night/ rotating,) and is now first shift.  3 Pm is end of work.  Tobacco use; none .  ETOH use never ,  Caffeine intake in form of Coffee( 2 mugs a day)  Soda( rare ) Tea ( hot.at home) , no energy drinks. Regular exercise - active life.      Sleep habits are as follows:  .  Review of Systems: Out of a complete 14 system review, the patient complains of only the following symptoms, and all other reviewed systems are negative.:  Single, unprovoked seizure- was possibly sleep deprived.  She woke up in the middle of the night , often sleeps only 4 hours.   Advil Pm and melatonin.   Social History   Socioeconomic History  . Marital status: Married    Spouse name: Not on file  . Number of children: Not on file  . Years of education: Not on file  . Highest education level: Not on file  Occupational History  . Not on file  Tobacco Use  . Smoking status: Never Smoker  . Smokeless tobacco: Never Used  Substance and Sexual Activity  . Alcohol use: No  . Drug use: No  . Sexual activity: Not on file  Other Topics Concern  . Not on file  Social History Narrative  . Not on file   Social Determinants of Health   Financial Resource Strain:   . Difficulty of Paying Living Expenses:     Food Insecurity:   . Worried About Charity fundraiser in the Last Year:   . Arboriculturist in the Last Year:   Transportation Needs:   . Film/video editor (Medical):   Marland Kitchen Lack of Transportation (Non-Medical):   Physical Activity:   . Days of Exercise per Week:   . Minutes of Exercise per Session:   Stress:   . Feeling of Stress :   Social Connections:   . Frequency of Communication with Friends and Family:   . Frequency of Social Gatherings with Friends and Family:   . Attends Religious Services:   . Active Member of Clubs or Organizations:   . Attends Archivist Meetings:   Marland Kitchen Marital Status:     Family History  Problem Relation Age of Onset  . Diabetes Mother     No past medical history on file.  Past Surgical History:  Procedure Laterality Date  . NEPHRECTOMY       Current Outpatient Medications on File Prior to Visit  Medication Sig Dispense Refill  . diphenhydrAMINE (BENADRYL) 25 MG tablet Take 1 tablet (25 mg total) by mouth every 6 (six) hours. 12 tablet 0  . famotidine (PEPCID) 20 MG tablet Take 1 tablet (20 mg total) by mouth 2 (two) times daily. 10 tablet 0  . ibuprofen (ADVIL) 200 MG tablet Take 200-400 mg by mouth every 6 (six) hours as needed for headache or mild pain.    . Melatonin 5 MG TABS Take 5-10 mg by mouth at bedtime as needed (for sleep).     No current facility-administered medications on file prior to visit.    No Known Allergies  Physical exam:  Today's Vitals   06/28/19 1410  BP: (!) 144/99  Pulse: 69  Temp: 98.2 F (36.8 C)  Weight: 177 lb (80.3 kg)  Height: 4\' 11"  (1.499 m)   Body mass index is 35.75 kg/m.   Wt Readings from Last 3 Encounters:  06/28/19 177 lb (80.3 kg)  11/15/17 188 lb (85.3 kg)  01/09/16 184 lb (83.5 kg)     Ht Readings from Last 3 Encounters:  06/28/19 4\' 11"  (1.499 m)  11/15/17 5\' 2"  (1.575 m)  01/09/16 5' 1.5" (1.562 m)      General: The patient is awake, alert and appears not in  acute distress.  The patient is well groomed. Head: Normocephalic, tongue bite mar noted.  Dental status:  Cardiovascular:  Regular rate and cardiac rhythm by pulse,  without distended neck veins. Respiratory: Lungs are clear to auscultation.  Skin:  Without evidence of ankle edema, or rash. Trunk: The patient's posture is erect.   Neurologic exam : The patient is awake and alert, oriented to place and time.   Memory subjective described as intact.  Attention span & concentration ability appears normal.  Speech is fluent,  without  dysarthria, dysphonia or aphasia.  Mood and affect are appropriate.   Cranial nerves: no loss of smell or taste reported  Pupils are equal and briskly reactive to light. Funduscopic exam deferred.   Extraocular movements in vertical and horizontal planes were intact and without nystagmus. No Diplopia. Visual fields by finger perimetry are intact. Hearing was intact to soft voice and finger rubbing.    Facial sensation intact to fine touch.  Facial motor strength is symmetric and tongue and uvula move midline.  Neck ROM : rotation, tilt and flexion extension were normal for age and shoulder shrug was symmetrical.    Motor exam:  Symmetric bulk, tone and ROM.   Normal tone without cog- wheeling, symmetric grip strength .   Sensory:  Fine touch, pinprick and vibration were tested  and  normal.  Proprioception tested in the upper extremities was normal. Coordination: Rapid alternating movements in the fingers/hands were of normal speed.  The Finger-to-nose maneuver was intact without evidence of ataxia, dysmetria or tremor. Gait and station: Patient could rise unassisted from a seated position, walked without assistive device.  Stance is of normal width/ base and the patient turned with 3 steps.  Toe and heel walk were deferred.  Deep tendon reflexes: in the  upper and lower extremities are symmetric and intact.  Babinski response was deferred.      Mrs.  Haider has filed  Short term absence form work until her work up is complete, she is under diving restriction.   She will be released to go to work earliest at the time her EEG and MRI brain are interpreted.    After spending a total time of 45 minutes face to face and additional time for physical and neurologic examination, review of laboratory studies,  personal review of imaging studies, reports and results of other testing and review of referral information / records as far as provided in visit, I have established the following assessments:  1) single seizure. Reports some deja-vu spells.   2) sleep deprived.  Headaches since seizure, may have hit her head.   3) no medication started unless EEG or MRI confirm an abnormality.     My Plan is to proceed with:  1)EEG MRI brain with and without contrast.  2) Rv within 14 days  right after tests are done.  3) short term disability until 4.15.2021/   I would like to thank [facility] and [facility] physicians for allowing me to meet with and to take care of this pleasant patient.   In short, [name] is presenting with a single seizure, tongue bite and headaches, amnesia for the event.  No aura.    I plan to follow up either personally or through our NP within 1 month. .  Electronically signed by: Melvyn Novas, MD 06/28/2019 2:32 PM  [facility] Board certified by The [employer] of Sleep Medicine and Diplomate of the [employer] of Sleep Medicine. Board certified In Neurology through the ABPN, Fellow of the [employer] of Neurology. Medical Director of [facility].

## 2019-07-05 ENCOUNTER — Telehealth: Payer: Self-pay | Admitting: Neurology

## 2019-07-05 NOTE — Telephone Encounter (Addendum)
Pt has called to inform that it has been over a week and she has not been contacted re: the scheduling of her EEG.  Please reply if this is still being ordered by Dr Vickey Huger.  Pt said she has until 04-05 to turn in her disability paperwork, she needs to have this resolved as soon as possible.

## 2019-07-06 ENCOUNTER — Telehealth: Payer: Self-pay | Admitting: Neurology

## 2019-07-06 DIAGNOSIS — E876 Hypokalemia: Secondary | ICD-10-CM | POA: Diagnosis not present

## 2019-07-06 DIAGNOSIS — R002 Palpitations: Secondary | ICD-10-CM | POA: Diagnosis not present

## 2019-07-06 NOTE — Telephone Encounter (Signed)
no to the covid questions MR Brain w/wo contrast Dr. Vickey Huger BCBS Auth: NPR Ref # [id number]. Patient is scheduled at [facility] for 07/13/19. Patient daughter also asked about scheduling a EEG but I do not see a EEG order in there.

## 2019-07-13 ENCOUNTER — Ambulatory Visit: Payer: BC Managed Care – PPO

## 2019-07-13 ENCOUNTER — Other Ambulatory Visit: Payer: Self-pay | Admitting: Neurology

## 2019-07-13 ENCOUNTER — Other Ambulatory Visit: Payer: Self-pay

## 2019-07-13 DIAGNOSIS — R569 Unspecified convulsions: Secondary | ICD-10-CM | POA: Diagnosis not present

## 2019-07-13 MED ORDER — GADOBENATE DIMEGLUMINE 529 MG/ML IV SOLN: 15.0000 mL | Freq: Once | INTRAVENOUS | Status: DC | PRN

## 2019-07-14 NOTE — Progress Notes (Signed)
No abnormal lesions are seen on diffusion-weighted views to suggest acute ischemia. The cortical sulci, fissures and cisterns are normal in size and appearance. Lateral, third and fourth ventricle are normal in size and appearance. No extra-axial fluid collections are seen. No evidence of mass effect or midline shift.    On coronal views no mesial temporal sclerosis or hippocampal atrophy.  On sagittal views the posterior fossa, pituitary gland and corpus callosum are unremarkable. No evidence of intracranial hemorrhage on gradient-echo views. The orbits and their contents, paranasal sinuses and calvarium are unremarkable.  Intracranial flow voids are present.   IMPRESSION:   Normal MRI brain (without).

## 2019-07-15 ENCOUNTER — Telehealth: Payer: Self-pay | Admitting: Neurology

## 2019-07-15 NOTE — Telephone Encounter (Signed)
Called the patient and there was no answer. LVM informing the pt that the MRI of brain was normal. Advised the patient to call back with any questions.

## 2019-07-15 NOTE — Telephone Encounter (Signed)
-----   Message from Melvyn Novas, MD sent at 07/14/2019  5:04 PM EDT ----- No abnormal lesions are seen on diffusion-weighted views to suggest acute ischemia. The cortical sulci, fissures and cisterns are normal in size and appearance. Lateral, third and fourth ventricle are normal in size and appearance. No extra-axial fluid collections are seen. No evidence of mass effect or midline shift.    On coronal views no mesial temporal sclerosis or hippocampal atrophy.  On sagittal views the posterior fossa, pituitary gland and corpus callosum are unremarkable. No evidence of intracranial hemorrhage on gradient-echo views. The orbits and their contents, paranasal sinuses and calvarium are unremarkable.  Intracranial flow voids are present.   IMPRESSION:   Normal MRI brain (without).

## 2019-07-26 ENCOUNTER — Other Ambulatory Visit: Payer: Self-pay

## 2019-07-26 ENCOUNTER — Ambulatory Visit (INDEPENDENT_AMBULATORY_CARE_PROVIDER_SITE_OTHER): Payer: BC Managed Care – PPO

## 2019-07-26 DIAGNOSIS — R569 Unspecified convulsions: Secondary | ICD-10-CM | POA: Diagnosis not present

## 2019-07-28 DIAGNOSIS — R569 Unspecified convulsions: Secondary | ICD-10-CM | POA: Insufficient documentation

## 2019-07-28 NOTE — Progress Notes (Signed)
Conclusion this is a normal EEG for the patient's age and  conscious state, with appropriate response to hyperventilation  and photic stimulation.  Melvyn Novas, MD

## 2019-07-28 NOTE — Procedures (Signed)
This is an EEG report for a study was a duration of over 90 minutes performed on 26 July 2019.  This EEG use the international 10-20 electrode placement convention und one electrode to follow the patient's heart rate.  And unincorporated video monitor was available on a separate screen. At the beginning of the study there is a low overall amplitude noted.  The patient closed her eyes which allowed establishing a posterior dominant rhythm of 11 Hz, which promptly attenuated again when the eyes were opened. The patient was then exposed to hyperventilation maneuvers.  There were 2 phase reversal activities isolated at T5 and C3 noted and another one after the hyperventilation maneuver concluded.  These do not appear to be epileptiform as they are not followed by any electronic document, change in rhythm, amplitude and are not periodic.  Following hyperventilation there is a slowing of the brainwave noticed but no amplitude buildup is usually expected.  The patient appears to become drowsy until photic stimulation is performed.  There is mild photic entrainment through all frequencies noted, and once photic stimulation ceased the patient became drowsy and she could sleep in and out of sleep stages N1 and N2 for the last 28 minutes of the recording. Bitemporal sleep architecture has been seen, as well as vertex sharp waves which are not epileptiform but indicate drowsiness and sleep. Conclusion this is a normal EEG for the patient's age and conscious state, with appropriate response to hyperventilation and photic stimulation. Melvyn Novas, MD

## 2019-07-29 ENCOUNTER — Encounter: Payer: Self-pay | Admitting: Neurology

## 2019-07-29 ENCOUNTER — Ambulatory Visit (INDEPENDENT_AMBULATORY_CARE_PROVIDER_SITE_OTHER): Payer: BC Managed Care – PPO | Admitting: Neurology

## 2019-07-29 ENCOUNTER — Other Ambulatory Visit: Payer: Self-pay

## 2019-07-29 VITALS — BP 141/89 | HR 71 | Temp 97.1°F | Ht 62.0 in | Wt 179.0 lb

## 2019-07-29 DIAGNOSIS — R569 Unspecified convulsions: Secondary | ICD-10-CM | POA: Diagnosis not present

## 2019-07-29 NOTE — Progress Notes (Signed)
Provider:  Larey Seat, MD  Primary Care Physician:  Skipper Cliche, [place]    Referring Provider: [facility], MD        Chief Complaint according to patient   Patient presents with:    . New Patient (Initial Visit)     pt with family member, rm 63. pt presents to discuss results from EEG and MRI. states she has not had anymore sz like events. she does describe having concerns of dizziness, blurred vision and becoming tired really fast. pt with family member, rm 64. pt presents to discuss results from EEG and MRI. states she has not had anymore sz like events. she does describe having concerns of dizziness, blurred vision and becoming tired really fast.       HISTORY OF PRESENT ILLNESS:   RV 07-29-2019,  The patient underwent MRI without contrast and EEG for a total time of over 90 minutes , both normal. She reports at work having blurred vision, some headaches and a feeling of nearly passing out, feeling weak . Her heart will be pounding. I did not intend to prescribe antiepileptics but le her go back to work when she reported this.  I will refer her to [facility] for EMU work up, she still has only seen [facility], not established primary care.  I would like to start her on TPM 25 mg , but she mentioned having had kidney stones. She had the left kidney removed.     [name] is a 49 year-old female patient seen here on 07/29/2019,  from ED.  Chief concern according to patient : " new onset seizure at work"  I have the pleasure of seeing [name] today, a right-handed [place] female with a new onset seizure. She a single seizure on file.  There is no Family history of seizure disorders.  She reportedly felt normal until lunch time, when a friend at work told her she looked tired.  She was calling her sister on the phone and apparently had speech arrest. The patient cannot recall the event-  She was witnessed by a  coworker, having fallen to the ground, from her seated position at the desk . She was convulsing for about 2 minutes and now breathing heavy, not responding. She almost appeared sleeping.  She bit her tongue. There was  urine and stool incontinence. Daughter arrived at the office.  An ambulance came and repeatedly seemed to drift in and out- normal speech -brought her to the ED  she already had recovered awareness.  Still was amnestic for the event. Had possible hit her head.  She called her daughter from the ED - spoke fluent.   The patient did not require stitches for the tongue bite, but she became more aware of soreness and headaches at the time she returned home.  All this took place on 21 June 2019 just last week head CT was obtained which was unremarkable but also done without contrast.  No bleed.  All the patient could recall that she was at work and apparently was told that she had collapse but there was no memory of the details.  Patient may have ceased less than 2 minutes even but she did have significant amnesia.  She was discharged by Dr. Jola Schmidt .   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  The room is very bright.  She  lives in  a household with 6 persons, husband daughter, son in Social worker and grandchild, 2 sons.  Her husband has mental illness, bipolar. Psychotic , paranoid. , schizophrenia  The patient currently  used to work in shifts( night/ rotating,) and is now first shift.  3 Pm is end of work.  Tobacco use; none .  ETOH use never ,  Caffeine intake in form of Coffee( 2 mugs a day)  Soda( rare ) Tea ( hot.at home) , no energy drinks. Regular exercise - active life.      Sleep habits are as follows:  .  Review of Systems: Out of a complete 14 system review, the patient complains of only the following symptoms, and all other reviewed systems are negative.:  Single, unprovoked  seizure- was possibly sleep deprived.  She woke up in the middle of the night , often sleeps only 4 hours.   Advil Pm and melatonin.   Social History   Socioeconomic History  . Marital status: Married    Spouse name: Not on file  . Number of children: Not on file  . Years of education: Not on file  . Highest education level: Not on file  Occupational History  . Not on file  Tobacco Use  . Smoking status: Never Smoker  . Smokeless tobacco: Never Used  Substance and Sexual Activity  . Alcohol use: No  . Drug use: No  . Sexual activity: Not on file  Other Topics Concern  . Not on file  Social History Narrative  . Not on file   Social Determinants of Health   Financial Resource Strain:   . Difficulty of Paying Living Expenses:   Food Insecurity:   . Worried About Programme researcher, broadcasting/film/video in the Last Year:   . Barista in the Last Year:   Transportation Needs:   . Freight forwarder (Medical):   Marland Kitchen Lack of Transportation (Non-Medical):   Physical Activity:   . Days of Exercise per Week:   . Minutes of Exercise per Session:   Stress:   . Feeling of Stress :   Social Connections:   . Frequency of Communication with Friends and Family:   . Frequency of Social Gatherings with Friends and Family:   . Attends Religious Services:   . Active Member of Clubs or Organizations:   . Attends Banker Meetings:   Marland Kitchen Marital Status:     Family History  Problem Relation Age of Onset  . Diabetes Mother     No past medical history on file.  Past Surgical History:  Procedure Laterality Date  . NEPHRECTOMY       Current Outpatient Medications on File Prior to Visit  Medication Sig Dispense Refill  . diphenhydrAMINE (BENADRYL) 25 MG tablet Take 1 tablet (25 mg total) by mouth every 6 (six) hours. 12 tablet 0  . famotidine (PEPCID) 20 MG tablet Take 1 tablet (20 mg total) by mouth 2 (two) times daily. 10 tablet 0  . ibuprofen (ADVIL) 200 MG tablet Take 200-400  mg by mouth every 6 (six) hours as needed for headache or mild pain.    . Melatonin 5 MG TABS Take 5-10 mg by mouth at bedtime as needed (for sleep).    . traZODone (DESYREL) 50 MG tablet Take 1 tablet (50 mg total) by mouth at bedtime as needed for sleep. 30 tablet 1   No current facility-administered medications on  file prior to visit.    No Known Allergies  Physical exam:  Today's Vitals   07/29/19 1455  BP: (!) 141/89  Pulse: 71  Temp: (!) 97.1 F (36.2 C)  Weight: 179 lb (81.2 kg)  Height: 5\' 2"  (1.575 m)   Body mass index is 32.74 kg/m.   Wt Readings from Last 3 Encounters:  07/29/19 179 lb (81.2 kg)  06/28/19 177 lb (80.3 kg)  11/15/17 188 lb (85.3 kg)     Ht Readings from Last 3 Encounters:  07/29/19 5\' 2"  (1.575 m)  06/28/19 4\' 11"  (1.499 m)  11/15/17 5\' 2"  (1.575 m)      General: The patient is awake, alert and appears not in acute distress.  The patient is well groomed. Head: Normocephalic, tongue bite mar noted.  Dental status:  Cardiovascular:  Regular rate and cardiac rhythm by pulse,  without distended neck veins. Respiratory: Lungs are clear to auscultation.  Skin:  Without evidence of ankle edema, or rash. Trunk: The patient's posture is erect.   Neurologic exam : The patient is awake and alert, oriented to place and time.   Memory subjective described as intact.  Attention span & concentration ability appears normal.  Speech is fluent,  without  dysarthria, dysphonia or aphasia.  Mood and affect are appropriate.   Cranial nerves: no loss of smell or taste reported  Pupils are equal and briskly reactive to light. Funduscopic exam deferred.   Extraocular movements in vertical and horizontal planes were intact and without nystagmus. No Diplopia. Visual fields by finger perimetry are intact. Hearing was intact to soft voice and finger rubbing.    Facial sensation intact to fine touch.  Facial motor strength is symmetric and tongue and uvula move  midline.  Neck ROM : rotation, tilt and flexion extension were normal for age and shoulder shrug was symmetrical.    Motor exam:  Symmetric bulk, tone and ROM.   Normal tone without cog- wheeling, symmetric grip strength .   Sensory:  Fine touch, pinprick and vibration were tested  and  normal.  Proprioception tested in the upper extremities was normal. Coordination: Rapid alternating movements in the fingers/hands were of normal speed.  The Finger-to-nose maneuver was intact without evidence of ataxia, dysmetria or tremor. Gait and station: Patient could rise unassisted from a seated position, walked without assistive device.  Stance is of normal width/ base and the patient turned with 3 steps.  Toe and heel walk were deferred.  Deep tendon reflexes: in the  upper and lower extremities are symmetric and intact.  Babinski response was deferred.      [name] has filed  Short term absence form work until her work up is complete, she is under diving restriction.   She will be released to go to work earliest at the time her EEG and MRI brain are interpreted.    After spending a total time of 45 minutes face to face and additional time for physical and neurologic examination, review of laboratory studies,  personal review of imaging studies, reports and results of other testing and review of referral information / records as far as provided in visit, I have established the following assessments:  1) single seizure. Reports some deja-vu spells.   2) sleep deprived.  Headaches since seizure, may have hit her head.   3) no medication started unless EEG or MRI confirm an abnormality.     My Plan is to proceed with:  1)EEG MRI brain with  and without contrast.  2) Rv within 14 days  right after tests are done.  3) short term disability until 4.15.2021/   I would like to thank [facility] and [facility] physicians for allowing me to meet with and to take care of this pleasant patient.   In  short, [name] is presenting with a single seizure, tongue bite and headaches, amnesia for the event.  No aura.    I plan to follow up either personally or through our NP within 1 month. .  Electronically signed by: Melvyn Novas, MD 07/29/2019 3:05 PM  [facility] Board certified by The [employer] of Sleep Medicine and Diplomate of the [employer] of Sleep Medicine. Board certified In Neurology through the ABPN, Fellow of the [employer] of Neu[employer]. Medical Director of [facility].

## 2019-09-10 ENCOUNTER — Other Ambulatory Visit (HOSPITAL_COMMUNITY)
Admission: RE | Admit: 2019-09-10 | Discharge: 2019-09-10 | Disposition: A | Discharge status: Home / Self Care | Payer: BC Managed Care – PPO | Source: Ambulatory Visit | Attending: Neurology | Admitting: Neurology

## 2019-09-10 DIAGNOSIS — Z20822 Contact with and (suspected) exposure to covid-19: Secondary | ICD-10-CM | POA: Diagnosis not present

## 2019-09-10 DIAGNOSIS — Z01812 Encounter for preprocedural laboratory examination: Secondary | ICD-10-CM | POA: Insufficient documentation

## 2019-09-10 DIAGNOSIS — Z79899 Other long term (current) drug therapy: Secondary | ICD-10-CM | POA: Diagnosis not present

## 2019-09-10 DIAGNOSIS — R569 Unspecified convulsions: Secondary | ICD-10-CM | POA: Diagnosis not present

## 2019-09-10 DIAGNOSIS — G40909 Epilepsy, unspecified, not intractable, without status epilepticus: Secondary | ICD-10-CM | POA: Diagnosis not present

## 2019-09-10 LAB — SARS CORONAVIRUS 2 (TAT 6-24 HRS): SARS Coronavirus 2: NEGATIVE

## 2019-09-13 ENCOUNTER — Inpatient Hospital Stay (HOSPITAL_COMMUNITY): Payer: BC Managed Care – PPO

## 2019-09-13 ENCOUNTER — Inpatient Hospital Stay (HOSPITAL_COMMUNITY)
Admission: RE | Admit: 2019-09-13 | Discharge: 2019-09-16 | DRG: 101 | Disposition: A | Discharge status: Home / Self Care | Payer: BC Managed Care – PPO | Source: Ambulatory Visit | Attending: Neurology | Admitting: Neurology

## 2019-09-13 DIAGNOSIS — Z20822 Contact with and (suspected) exposure to covid-19: Secondary | ICD-10-CM | POA: Diagnosis present

## 2019-09-13 DIAGNOSIS — G40909 Epilepsy, unspecified, not intractable, without status epilepticus: Secondary | ICD-10-CM | POA: Diagnosis present

## 2019-09-13 DIAGNOSIS — Z79899 Other long term (current) drug therapy: Secondary | ICD-10-CM

## 2019-09-13 DIAGNOSIS — R569 Unspecified convulsions: Secondary | ICD-10-CM | POA: Diagnosis not present

## 2019-09-13 LAB — COMPREHENSIVE METABOLIC PANEL
ALT: 20 U/L (ref 0–44)
AST: 22 U/L (ref 15–41)
Albumin: 3.5 g/dL (ref 3.5–5.0)
Alkaline Phosphatase: 70 U/L (ref 38–126)
Anion gap: 7 (ref 5–15)
BUN: 12 mg/dL (ref 6–20)
CO2: 26 mmol/L (ref 22–32)
Calcium: 9.3 mg/dL (ref 8.9–10.3)
Chloride: 105 mmol/L (ref 98–111)
Creatinine, Ser: 0.75 mg/dL (ref 0.44–1.00)
GFR calc Af Amer: >60 mL/min (ref >60)
GFR calc non Af Amer: >60 mL/min (ref >60)
Glucose, Bld: 78 mg/dL (ref 70–99)
Potassium: 4.1 mmol/L (ref 3.5–5.1)
Sodium: 138 mmol/L (ref 135–145)
Total Bilirubin: 0.7 mg/dL (ref 0.3–1.2)
Total Protein: 7 g/dL (ref 6.5–8.1)

## 2019-09-13 LAB — CBC WITH DIFFERENTIAL/PLATELET
Abs Immature Granulocytes: 0.02 10*3/uL (ref 0.00–0.07)
Basophils Absolute: 0 10*3/uL (ref 0.0–0.1)
Basophils Relative: 1 %
Eosinophils Absolute: 0.1 10*3/uL (ref 0.0–0.5)
Eosinophils Relative: 1 %
HCT: 38.4 % (ref 36.0–46.0)
Hemoglobin: 12.4 g/dL (ref 12.0–15.0)
Immature Granulocytes: 0 %
Lymphocytes Relative: 44 %
Lymphs Abs: 2 10*3/uL (ref 0.7–4.0)
MCH: 28.7 pg (ref 26.0–34.0)
MCHC: 32.3 g/dL (ref 30.0–36.0)
MCV: 88.9 fL (ref 80.0–100.0)
Monocytes Absolute: 0.5 10*3/uL (ref 0.1–1.0)
Monocytes Relative: 11 %
Neutro Abs: 1.9 10*3/uL (ref 1.7–7.7)
Neutrophils Relative %: 43 %
Platelets: UNDETERMINED 10*3/uL (ref 150–400)
RBC: 4.32 MIL/uL (ref 3.87–5.11)
RDW: 12.6 % (ref 11.5–15.5)
WBC: 4.5 10*3/uL (ref 4.0–10.5)
nRBC: 0 % (ref 0.0–0.2)

## 2019-09-13 LAB — PROTIME-INR
INR: 1 (ref 0.8–1.2)
Prothrombin Time: 12.8 seconds (ref 11.4–15.2)

## 2019-09-13 LAB — MAGNESIUM: Magnesium: 2 mg/dL (ref 1.7–2.4)

## 2019-09-13 LAB — HIV ANTIBODY (ROUTINE TESTING W REFLEX): HIV Screen 4th Generation wRfx: NONREACTIVE

## 2019-09-13 LAB — PHOSPHORUS: Phosphorus: 3.8 mg/dL (ref 2.5–4.6)

## 2019-09-13 MED ORDER — LABETALOL HCL 5 MG/ML IV SOLN: 5.0000 mg | INTRAVENOUS | Status: DC | PRN

## 2019-09-13 MED ORDER — ENOXAPARIN SODIUM 40 MG/0.4ML ~~LOC~~ SOLN
40.0000 mg | SUBCUTANEOUS | Status: DC
  Filled 2019-09-13 (×2): qty 0.4

## 2019-09-13 MED ORDER — ACETAMINOPHEN 325 MG PO TABS: 650.0000 mg | ORAL_TABLET | ORAL | Status: DC | PRN

## 2019-09-13 MED ORDER — SODIUM CHLORIDE 0.9% FLUSH
3.0000 mL | Freq: Two times a day (BID) | INTRAVENOUS | Status: DC
  Administered 2019-09-13 – 2019-09-15 (×6): 3 mL via INTRAVENOUS

## 2019-09-13 MED ORDER — MELATONIN 5 MG PO TABS
10.0000 mg | ORAL_TABLET | Freq: Every day | ORAL | Status: DC
  Administered 2019-09-13 – 2019-09-14 (×2): 10 mg via ORAL
  Filled 2019-09-13 (×2): qty 2

## 2019-09-13 MED ORDER — LORAZEPAM 2 MG/ML IJ SOLN: 2.0000 mg | INTRAMUSCULAR | Status: DC | PRN

## 2019-09-13 NOTE — H&P (Signed)
CC: Seizure  History is obtained from: Patient, son at bedside, chart review  HPI: [name] is a 49 y.o. female with no significant past medical history who is admitted to epilepsy monitoring unit for characterization of spells.  Patient states her first episode was on 06/21/2019 while she was at work and talking on phone.  She does not remember the episode but was told that she fell down and lost consciousness.  She remembers waking up in the ambulance.  Per review of the notes from ED physician Dr. Darl Householder, EMS noted 1 minute of generalized tonic-clonic seizure-like activity and patient was postictal after the event.  CT head was performed without contrast in the ED which did not show any acute abnormality.  Patient was already back to baseline and therefore was discharged home with neurology follow-up.  She was then seen by Dr. Brett Fairy at [facility] where patient had routine EEG which was within normal limits.  Patient also had an MRI brain without contrast did not show any acute abnormality.  However per patient's son at bedside, patient continues to have episodes frequently where she is usually standing or sitting and stiffens up with bilateral upper extremity flexion posturing.  Patient son states patient is able to communicate during these episodes and these episodes last for a few minutes after which she is back to baseline.  Patient 's repeatedly states she has been under a lot of stress, has 5 children and her husband is schizophrenic/bipolar.  Seizure disorders: Denies any prior history of seizures, family history of epilepsy, history of trauma with loss of consciousness, meningitis/encephalitis.  Current AEDs: None   ROS: All other systems reviewed and negative except as noted in the HPI.    Family History  Problem Relation Age of Onset  . Diabetes Mother    Social History:  reports that she has never smoked. She has never used smokeless tobacco. She reports that  she does not drink alcohol or use drugs.   Exam: Current vital signs: BP 127/72 (BP Location: Left Arm)   Pulse 72   Temp 98.1 F (36.7 C) (Oral)   Resp 14   Ht 5' (1.524 m)   Wt 80.7 kg   SpO2 98%   BMI 34.76 kg/m  Vital signs in last 24 hours: Temp:  [98.1 F (36.7 C)] 98.1 F (36.7 C) (06/07 0812) Pulse Rate:  [72] 72 (06/07 0812) Resp:  [14] 14 (06/07 0812) BP: (127)/(72) 127/72 (06/07 0812) SpO2:  [98 %] 98 % (06/07 0812) Weight:  [80.7 kg] 80.7 kg (06/07 1107)   Physical Exam  Constitutional: Appears well-developed and well-nourished.  Psych: Affect appropriate to situation Eyes: No scleral injection HENT: No OP obstrucion Head: Normocephalic, atraumatic Cardiovascular: Normal rate and regular rhythm.  Respiratory: Effort normal, non-labored breathing GI: Soft.  No distension. There is no tenderness.  Skin: Warm, no apparent ulcers Neuro: AOx3, cranial nerves II 12 grossly intact, 5/5 in all 4 extremities.   I have reviewed labs in epic and the results pertinent to this consultation are: Reviewed CBC, CMP, magnesium, phosphorus, INR: All within normal limits.  I have reviewed the images obtained MRI brain without contrast 07/14/2019: No acute abnormality.  ASSESSMENT/PLAN: 49 year old female with new onset seizure-like episodes admitted to epilepsy monitoring unit for characterization of spells.  Convulsions -We will start video EEG monitoring for characterization of spells -Patient not on any AEDs at this point. -We will perform hyperventilation, photic stimulation and sleep deprivation starting tomorrow -Patient requested  melatonin to help with sleep at night, ordered Seizure precautions-as needed IV Ativan for generalized tonic-clonic seizure lasting more than 2 minutes of acute events over than 5 minutes  I have spent a total of  80 minutes with the patient reviewing hospital notes,  test results, labs and examining the patient as well as establishing an  assessment and plan that was discussed personally with the patient, son at bedside.  > 50% of time was spent in direct patient care.    Lindie Spruce Epilepsy [facility]

## 2019-09-13 NOTE — Progress Notes (Signed)
Late Entry:  Arrived to pt room. Pt has no family member to sit with them for the stay. Explained that we need the family member/sitter to stay the duration of EMU stay. Pt states her son will be back in 30 mins he went home. Will try back later for hook up

## 2019-09-13 NOTE — Progress Notes (Signed)
Late Entry  EMU Hookup complete - no initial skin breakdown.  Sitter duties explained. [facility] notified and monitoring.  Push button tested with [facility].

## 2019-09-14 NOTE — Plan of Care (Signed)
  Problem: Activity: Goal: Risk for activity intolerance will decrease Outcome: Progressing   Problem: Nutrition: Goal: Adequate nutrition will be maintained Outcome: Progressing   Problem: Coping: Goal: Level of anxiety will decrease Outcome: Progressing   Problem: Safety: Goal: Ability to remain free from injury will improve Outcome: Progressing   

## 2019-09-14 NOTE — Progress Notes (Signed)
EMU maint'd. No skin breakdown at c3, p3, c4, p4

## 2019-09-14 NOTE — Progress Notes (Signed)
Subjective: No acute events overnight.  No new concerns.  ROS: negative except above  Examination  Vital signs in last 24 hours: Temp:  [97.8 F (36.6 C)-98.7 F (37.1 C)] 98.1 F (36.7 C) (06/08 0727) Pulse Rate:  [60-74] 61 (06/08 0727) Resp:  [14-18] 16 (06/08 0727) BP: (102-119)/(57-88) 119/88 (06/08 0727) SpO2:  [97 %-100 %] 100 % (06/08 0727)  General: lying in bed, not in apparent distress CVS: pulse-normal rate and rhythm RS: breathing comfortably, CTA B Extremities: normal, warm  Neuro: MS: Alert, oriented, follows commands CN: pupils equal and reactive,  EOMI, face symmetric, tongue midline, normal sensation over face, Motor: 5/5 strength in all 4 extremities Reflexes: 2+ bilaterally over patella, biceps, plantars: flexor Coordination: normal  Basic Metabolic Panel: Recent Labs  Lab 09/13/19 0950  NA 138  K 4.1  CL 105  CO2 26  GLUCOSE 78  BUN 12  CREATININE 0.75  CALCIUM 9.3  MG 2.0  PHOS 3.8    CBC: Recent Labs  Lab 09/13/19 0950  WBC 4.5  NEUTROABS 1.9  HGB 12.4  HCT 38.4  MCV 88.9  PLT PLATELET CLUMPS NOTED ON SMEAR, UNABLE TO ESTIMATE     Coagulation Studies: Recent Labs    09/13/19 0950  LABPROT 12.8  INR 1.0    Imaging No new brain imaging overnight   ASSESSMENT AND PLAN: 49 year old female with new onset seizure-like episodes admitted to epilepsy monitoring unit for characterization of spells.  Convulsions - Continue video EEG monitoring for characterization of spells - Patient not on any AEDs at this point. - Seizure precautions - as needed IV Ativan for generalized tonic-clonic seizure lasting more than 2 minutes of acute events over than 5 minutes  I have spent a total of30 minuteswith the patient reviewing hospitalnotes,  test results, labs and examining the patient as well as establishing an assessment and plan that was discussed personally with the patient, son at bedside.>50% of time was spent in direct  patient care.  Lindie Spruce Epilepsy [facility] For questions after 5pm please refer to AMION to reach the Neurologist on call

## 2019-09-14 NOTE — Procedures (Signed)
Patient Name: Trenton Verne  MRN: [id number]  Epilepsy Attending: Charlsie Quest  Referring Physician/Provider: Dr. Lindie Spruce Duration: 09/13/2019 5844 to 09/14/2019 1712  Patient history: 49 year old female with new onset seizure-like episodes.  EEG to evaluate for seizures.  Level of alertness: Awake, asleep  AEDs during EEG study: None  Technical aspects: This EEG study was done with scalp electrodes positioned according to the 10-20 International system of electrode placement. Electrical activity was acquired at a sampling rate of 500Hz  and reviewed with a high frequency filter of 70Hz  and a low frequency filter of 1Hz . EEG data were recorded continuously and digitally stored.   Description: The posterior dominant rhythm consists of 10-11 Hz activity of moderate voltage (25-35 uV) seen predominantly in posterior head regions, symmetric and reactive to eye opening and eye closing. Sleep was characterized by vertex waves, sleep spindles (12 to 14 Hz), maximal frontocentral region.  Hyperventilation and photic stimulation were not performed.     IMPRESSION: This study is within normal limits. No seizures or epileptiform discharges were seen throughout the recording.  Jeanene Mena 

## 2019-09-15 NOTE — Procedures (Signed)
Patient Name: [name]  MRN: [id number]  Epilepsy Attending: Charlsie Quest  Referring Physician/Provider: Dr. Lindie Spruce Duration: 09/14/2019 8850 to 09/15/2019 2774  Patient history: 49 year old female with new onset seizure-like episodes.  EEG to evaluate for seizures.  Level of alertness: Awake, asleep  AEDs during EEG study: None  Technical aspects: This EEG study was done with scalp electrodes positioned according to the 10-20 International system of electrode placement. Electrical activity was acquired at a sampling rate of 500Hz  and reviewed with a high frequency filter of 70Hz  and a low frequency filter of 1Hz . EEG data were recorded continuously and digitally stored.   Description: The posterior dominant rhythm consists of 10-11 Hz activity of moderate voltage (25-35 uV) seen predominantly in posterior head regions, symmetric and reactive to eye opening and eye closing. Sleep was characterized by vertex waves, sleep spindles (12 to 14 Hz), maximal frontocentral region.  Hyperventilation and photic stimulation were not performed.     IMPRESSION: This study is within normal limits. No seizures or epileptiform discharges were seen throughout the recording.  [name] 

## 2019-09-15 NOTE — Progress Notes (Signed)
Subjective: No acute events overnight.  ROS: negative except above Examination  Vital signs in last 24 hours: Temp:  [98.6 F (37 C)-98.8 F (37.1 C)] 98.6 F (37 C) (06/09 0812) Pulse Rate:  [63-76] 66 (06/09 0812) Resp:  [11-16] 16 (06/09 0812) BP: (106-131)/(67-81) 106/71 (06/09 0812) SpO2:  [97 %-100 %] 100 % (06/09 0812)  General: lying in bed, not in apparent distress CVS: pulse-normal rate and rhythm RS: breathing comfortably, CTA B Extremities: normal, warm  Neuro: MS: Alert, oriented, follows commands CN: pupils equal and reactive,  EOMI, face symmetric, tongue midline, normal sensation over face, Motor: 5/5 strength in all 4 extremities Reflexes: 2+ bilaterally over patella, biceps, plantars: flexor Coordination: normal  Basic Metabolic Panel: Recent Labs  Lab 09/13/19 0950  NA 138  K 4.1  CL 105  CO2 26  GLUCOSE 78  BUN 12  CREATININE 0.75  CALCIUM 9.3  MG 2.0  PHOS 3.8    CBC: Recent Labs  Lab 09/13/19 0950  WBC 4.5  NEUTROABS 1.9  HGB 12.4  HCT 38.4  MCV 88.9  PLT PLATELET CLUMPS NOTED ON SMEAR, UNABLE TO ESTIMATE     Coagulation Studies: Recent Labs    09/13/19 0950  LABPROT 12.8  INR 1.0    Imaging No new brain imaging overnight   ASSESSMENT AND PLAN: 49 year old female with new onset seizure-like episodes admitted to epilepsy monitoring unit for characterization of spells.  Convulsions - Continue video EEG monitoring for characterization of spells - Patient not on any AEDs at this point. - Sleep deprivation tonight, will hold melatonin - Seizure precautions - as needed IV Ativan for generalized tonic-clonic seizure lasting more than 2 minutes of acute events over than 5 minutes  I have spent a total of70minuteswith the patient reviewing hospitalnotes, test results, labs and examining the patient as well as establishing an assessment and plan that was discussed personally with the patient, son at bedside.>50% of  time was spent in direct patient care.  Lindie Spruce Epilepsy [facility] For questions after 5pm please refer to AMION to reach the Neurologist on call

## 2019-09-15 NOTE — Progress Notes (Signed)
Completed hyperventilation and photic stimulation on patient.  Fp2 lead was moved toward nose 1/2 cm due to mild skin irritation,  No skin breakdown

## 2019-09-15 NOTE — Progress Notes (Signed)
VLTM EEG maint complete. Re gelled leads. No skin breakdown at FP1 FP2 A1 continue to monitor

## 2019-09-16 ENCOUNTER — Encounter: Payer: Self-pay | Admitting: *Deleted

## 2019-09-16 ENCOUNTER — Telehealth: Payer: Self-pay | Admitting: *Deleted

## 2019-09-16 NOTE — Progress Notes (Signed)
Patient was discharged from the unit via wheelchair all personal belongings were collected including 2 phone chargers found by nurse. Patient was picked-up by adult daughter.

## 2019-09-16 NOTE — Procedures (Addendum)
Patient Name:[name] JQG:[id number] Epilepsy Attending:Rieley Khalsa [name] Referring Physician/Provider:Dr. Lindie Spruce Duration:6/9/20210927 to6/10/20210938  Patient history:49 year old female with new onset seizure-like episodes. EEG to evaluate for seizures.  Level of alertness:Awake, asleep  AEDs during EEG study:None  Technical aspects: This EEG study was done with scalp electrodes positioned according to the 10-20 International system of electrode placement. Electrical activity was acquired at a sampling rate of 500Hz  and reviewed with a high frequency filter of 70Hz  and a low frequency filter of 1Hz . EEG data were recorded continuously and digitally stored.   Description: The posterior dominant rhythm consists of10-11Hz  activity of moderate voltage (25-35 uV) seen predominantly in posterior head regions, symmetric and reactive to eye opening and eye closing. Sleep was characterized by vertex waves, sleep spindles (12 to 14 Hz), maximal frontocentral region. Physiologic photic driving was seen during photic stimulation.  No EEG change was seen during hyperventilation.  IMPRESSION: This study is within normal limits. No seizures or epileptiform discharges were seen throughout the recording.  Katonya Blecher 

## 2019-09-16 NOTE — Progress Notes (Signed)
Discharge instructions reviewed with patient and daughter. All question answered. Patient and daughter both verbalized understanding discharge instruction. Personal belongings gathered from the room by patient with the help of daughter. Patient will be discharged from the unit via wheelchair into personal vehicle that will be driven by patient's son. Awaiting son's arrival at this time.

## 2019-09-16 NOTE — TOC Transition Note (Signed)
Transition of Care [facility]) - CM/SW Discharge Note   Patient Details  Name: [name] MRN: [id number] Date of Birth: 31-Jul-1970  Transition of Care [facility]) CM/SW Contact:  Kermit Balo, RN Phone Number: 09/16/2019, 10:43 AM   Clinical Narrative:    Pt discharging home with self care. No needs per TOC.    Final next level of care: Home/Self Care Barriers to Discharge: No Barriers Identified   Patient Goals and CMS Choice        Discharge Placement                       Discharge Plan and Services                                     Social Determinants of Health (SDOH) Interventions     Readmission Risk Interventions No flowsheet data found.

## 2019-09-16 NOTE — Plan of Care (Signed)
  Problem: Coping: Goal: Level of anxiety will decrease Outcome: Progressing   Problem: Safety: Goal: Ability to remain free from injury will improve Outcome: Progressing   Problem: Skin Integrity: Goal: Risk for impaired skin integrity will decrease Outcome: Progressing   

## 2019-09-16 NOTE — Progress Notes (Signed)
PIV removed from left [facility] without complication per Dr. Candi Leash verbal order.

## 2019-09-16 NOTE — Telephone Encounter (Signed)
Pt need a letter to go back to work. Pt had eeg done today. Please call [phone]

## 2019-09-16 NOTE — Discharge Summary (Signed)
Physician Discharge Summary  Patient ID: [name] MRN: [id number] DOB/AGE: [birth year] 49 y.o.  Admit date: 09/13/2019 Discharge date: 09/16/2019  Admission Diagnoses: Convulsions  Discharge Diagnoses:  Active Problems:   Convulsion [facility])   Discharged Condition: stable  Hospital Course: Ms. Schwall was admitted to epilepsy monitoring unit from 09/13/2019 to 09/16/2019 during which he underwent continuous video EEG monitoring as well as seizure provocative measures including hyperventilation, photic stimulation as well as sleep deprivation.  Her EEG was within normal limits, no seizures or epileptiform abnormalities were noted.  Even though no events were captured during the stay, the semiology of the episodes is highly suspicious for nonepileptic events.  This was discussed with patient and patient and family expressed understanding.  Management of nonepileptic events including cognitive behavioral therapy were also discussed with patient.  She is currently not on antiepileptic's and I do not think she needs to be on antiepileptics for these episodes.  Continue seizure precautions until cleared by physician.  Consults: None  Significant Diagnostic Studies: EEG: The posterior dominant rhythm consists of10-11Hz  activity of moderate voltage (25-35 uV) seen predominantly in posterior head regions, symmetric and reactive to eye opening and eye closing. Sleep was characterized by vertex waves, sleep spindles (12 to 14 Hz), maximal frontocentral region. Physiologic photic driving was seen during photic stimulation.  No EEG change was seen during hyperventilation.IMPRESSION: This study is within normal limits. No seizures or epileptiform discharges were seen throughout the recording.  Treatments: No antiepileptics were started.  Discharge Exam: Blood pressure 115/72, pulse 65, temperature 98.4 F (36.9 C), temperature source Oral, resp. rate 14, height 5' (1.524 m), weight 80.7 kg, SpO2 99  %. General: lying in bed,not in apparent distress CVS: pulse-normal rate and rhythm RS: breathing comfortably,CTA B Extremities: normal,warm  Neuro: MS: Alert, oriented, follows commands CN: pupils equal and reactive, EOMI, face symmetric, tongue midline, normal sensation over face, Motor: 5/5 strength in all 4 extremities Reflexes: 2+ bilaterally over patella, biceps, plantars: flexor Coordination: normal   Disposition: Discharge disposition: 01-Home or Self Care   Discharge Instructions    Call MD for:   Complete by: As directed    Change in seizure like episodes   Diet - low sodium heart healthy   Complete by: As directed    Increase activity slowly   Complete by: As directed      Allergies as of 09/16/2019   No Known Allergies     Medication List    STOP taking these medications   traZODone 50 MG tablet Commonly known as: DESYREL     TAKE these medications   Melatonin 10 MG Tabs Take 10 mg by mouth at bedtime.   multivitamin with minerals Tabs tablet Take 1 tablet by mouth daily after lunch.   naproxen sodium 220 MG tablet Commonly known as: ALEVE Take 220 mg by mouth 2 (two) times daily as needed (pain/headache).         I have spent a total of  48 minutes with the patient reviewing hospital notes,  test results, labs and examining the patient as well as establishing an assessment and plan that was discussed personally with the patient.  > 50% of time was spent in direct patient care.     Signed: Charlsie Quest 09/16/2019, 9:11 AM

## 2019-09-16 NOTE — Telephone Encounter (Signed)
I need first to read the EEG -please.!

## 2019-09-16 NOTE — Discharge Instructions (Signed)
You were admitted to epilepsy monitoring unit from 09/13/2019 to 09/16/2019.  During this time, he underwent video EEG monitoring, hyperventilation, photic stimulation as well as sleep deprivation.  No events were captured during the stay.  Your EEG was within normal limits.  We highly suspect you have nonepileptic events and therefore do not need to be on antiepileptic medications at this point.  We discussed strategies to manage nonepileptic events including cognitive behavioral therapy.  Please continue to follow-up with your primary neurologist Dr. Vickey Huger.  Continue seizure precautions.

## 2019-09-16 NOTE — Progress Notes (Signed)
vLTM EEG complete. No skin breakdown 

## 2019-09-17 ENCOUNTER — Telehealth: Payer: Self-pay | Admitting: Neurology

## 2019-09-17 NOTE — Telephone Encounter (Signed)
No epileptiform activity was seen on 2  ambulatory EEG studies and neither during a 3 day EMU stay. See Dr. Terrilee Croak report.    [name] is unlikely to suffer from epilepsy. Unclear remains why she suffered a seizure like event.   She should be able to return to work unrestricted and her driving privileges will be reinstated after 6 month of seizure free interval.   The patient will be advised to establish a primary care provider as requested in April 2021.    Melvyn Novas, MD

## 2019-09-20 NOTE — Telephone Encounter (Signed)
I contacted the pt and advised of results. She verbalized understanding.  Pt is asking if the return to work paper work was received from her employer? I advised I would have to check with debra on this. I know she has been working on getting this taken care of for the pt.

## 2019-09-28 ENCOUNTER — Telehealth: Payer: Self-pay | Admitting: *Deleted

## 2019-09-28 ENCOUNTER — Encounter: Payer: Self-pay | Admitting: Neurology

## 2019-09-28 NOTE — Telephone Encounter (Signed)
I faxed pt [employer] form and medical records to [phone]

## 2019-10-04 NOTE — Telephone Encounter (Signed)
[name] with [employer] called stating she would like the results to the patient EEG for her short term claim. Her direct phone number is [phone] & fax # [phone].  She also wants to know the reason on the delay of whty the eeg wasn't schedule until June when it was ordered in April.

## 2019-10-05 NOTE — Telephone Encounter (Signed)
Done eeg faxed to [facility] [phone]

## 2019-10-21 DIAGNOSIS — Z03818 Encounter for observation for suspected exposure to other biological agents ruled out: Secondary | ICD-10-CM | POA: Diagnosis not present

## 2019-10-21 DIAGNOSIS — Z20822 Contact with and (suspected) exposure to covid-19: Secondary | ICD-10-CM | POA: Diagnosis not present

## 2019-10-26 ENCOUNTER — Telehealth: Payer: Self-pay | Admitting: *Deleted

## 2019-10-26 NOTE — Telephone Encounter (Signed)
Pt [employer] form on [place] desk. 

## 2019-11-03 ENCOUNTER — Telehealth: Payer: Self-pay | Admitting: Neurology

## 2019-11-03 NOTE — Telephone Encounter (Signed)
Pt stopped by the lobby to say that they appealing the claim - she is requesting documents by tomorrow to support the appeal. She states she did not go back to work due to her mental state and per  recommendations made by Dr. Vickey Huger.  Best call back is [phone].

## 2019-11-04 ENCOUNTER — Encounter: Payer: Self-pay | Admitting: Neurology

## 2019-11-04 NOTE — Telephone Encounter (Signed)
I faxed the form twice to the insurance company. I have a copy of the form @ the front desk for patient to p/u.

## 2019-11-04 NOTE — Telephone Encounter (Signed)
Called the patient to advise that the appeals form that was brought in last week was completed and signed and faxed by Stanton Kidney twice. Informed her that she has been released to go back to work since Dr Vickey Huger released her in June so I am unsure what is going on there. I have advised the pt to call back with any questions otherwise the form is completed and a copy at the front is available for them to pick up

## 2019-11-10 DIAGNOSIS — Z20822 Contact with and (suspected) exposure to covid-19: Secondary | ICD-10-CM | POA: Diagnosis not present

## 2019-11-11 DIAGNOSIS — M25569 Pain in unspecified knee: Secondary | ICD-10-CM | POA: Diagnosis not present

## 2019-11-23 ENCOUNTER — Telehealth: Payer: Self-pay | Admitting: Neurology

## 2019-11-23 NOTE — Telephone Encounter (Signed)
Received a fax from [employer], the disability company she is going through, and they are requesting our office to call to discuss patients functional capabilities and any limitations.  I contacted the # [phone] and the person handling her claim was not available. A message was left for the them and they will call back.

## 2020-01-01 DIAGNOSIS — Z20822 Contact with and (suspected) exposure to covid-19: Secondary | ICD-10-CM | POA: Diagnosis not present

## 2020-04-10 DIAGNOSIS — Z20822 Contact with and (suspected) exposure to covid-19: Secondary | ICD-10-CM | POA: Diagnosis not present

## 2020-04-11 DIAGNOSIS — U071 COVID-19: Secondary | ICD-10-CM | POA: Diagnosis not present

## 2020-04-12 ENCOUNTER — Other Ambulatory Visit: Payer: BC Managed Care – PPO

## 2020-05-02 DIAGNOSIS — D1724 Benign lipomatous neoplasm of skin and subcutaneous tissue of left leg: Secondary | ICD-10-CM | POA: Diagnosis not present

## 2020-06-01 DIAGNOSIS — Z20822 Contact with and (suspected) exposure to covid-19: Secondary | ICD-10-CM | POA: Diagnosis not present

## 2020-06-13 IMAGING — CT CT HEAD W/O CM
4 series · 17 of 47 positions shown, 19 images · non-contrast
Comparison: Head CT dated 11/07/2018.

CLINICAL DATA: 48-year-old female with seizure.

EXAM:
CT HEAD WITHOUT CONTRAST
TECHNIQUE: Contiguous axial images were obtained from the base of the skull
through the vertex without intravenous contrast.

[Series 3: head without · axial · non-contrast · 0.42mm/px · z∈[-142,-22]mm · 7 of 33 slices shown, 9 images]
[im 5/33  brain]
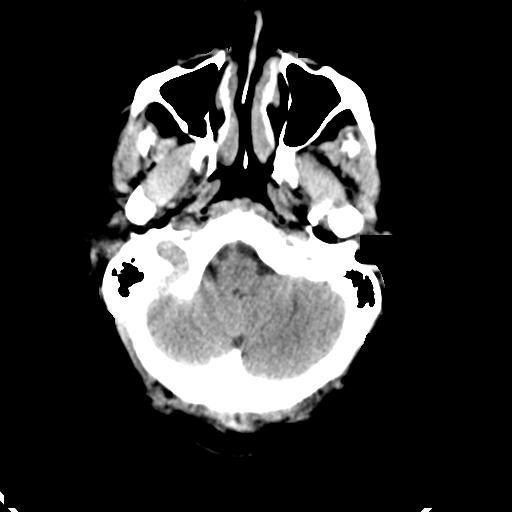
[im 5/33  bone]
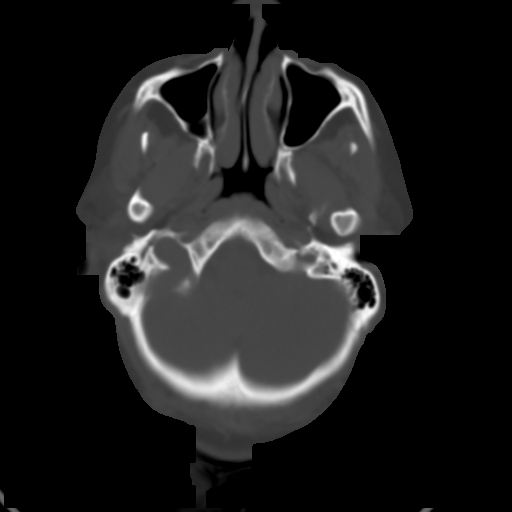
[im 9/33  brain]
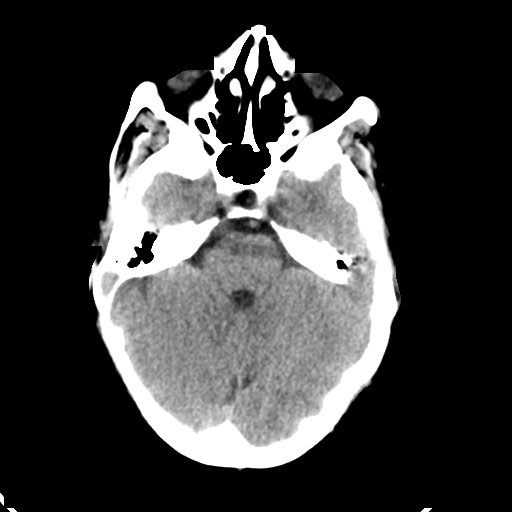
[im 13/33  brain]
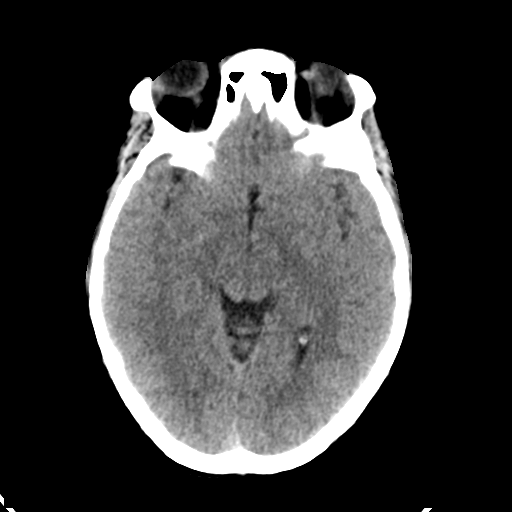
[im 17/33  brain]
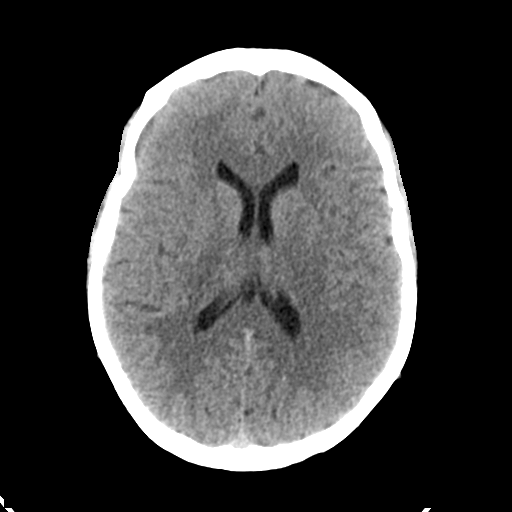
[im 21/33  brain]
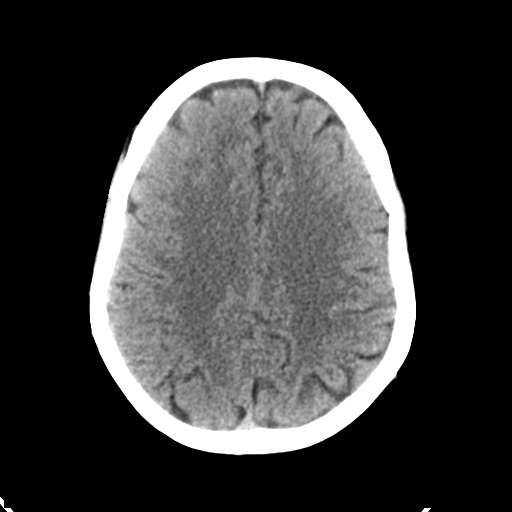
[im 21/33  bone]
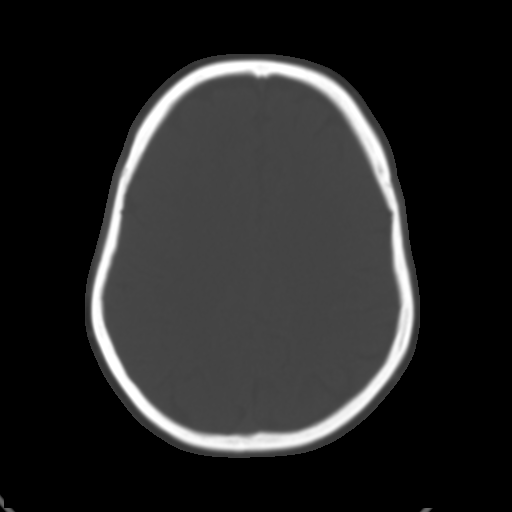
[im 25/33  brain]
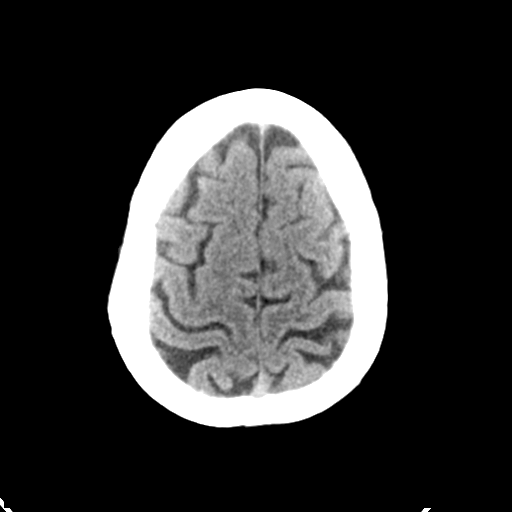
[im 29/33  brain]
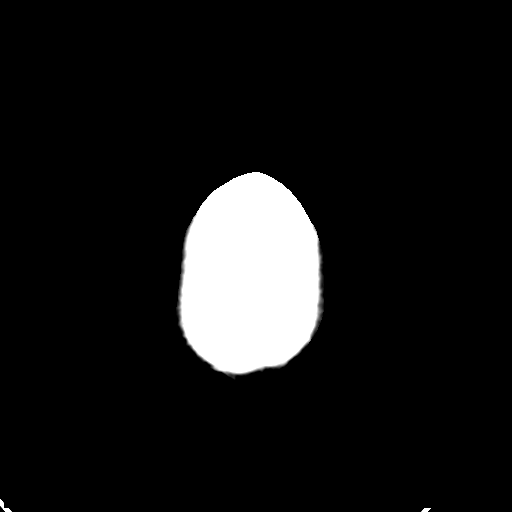

[Series 4: head bone · axial · 0.42mm/px · z∈[-146,-90]mm · 4 of 82 slices shown]
[im 9/82  bone]
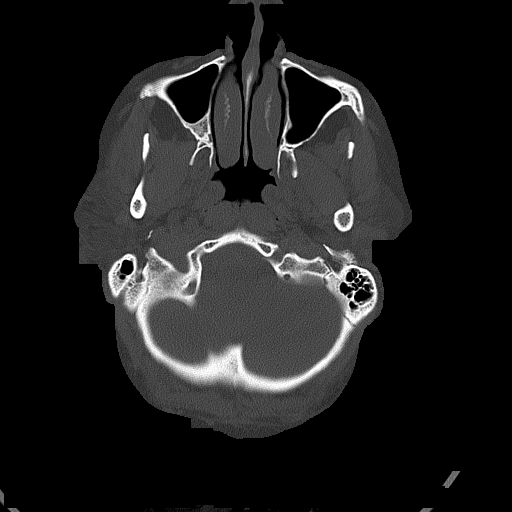
[im 17/82  bone]
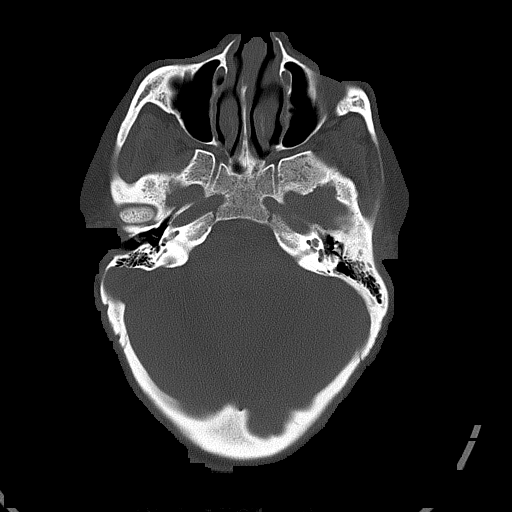
[im 25/82  bone]
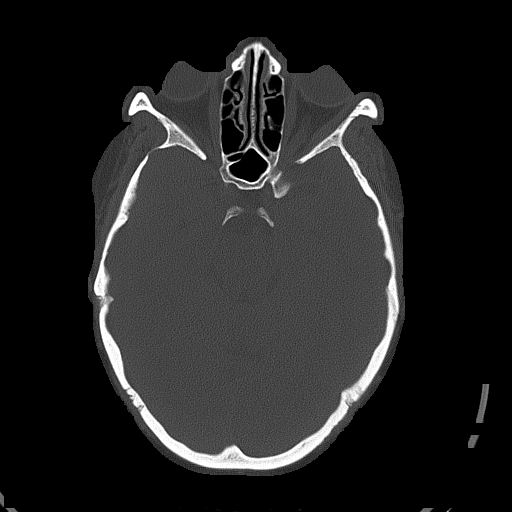
[im 37/82  bone]
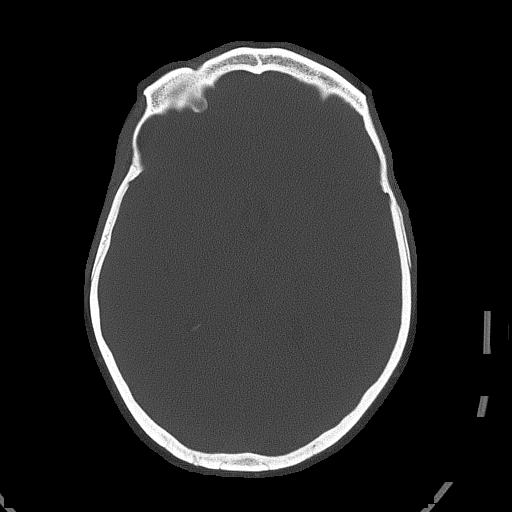

[Series 5: head without cor · coronal · non-contrast · 0.32mm/px · 3 of 71 slices shown]
[im 24/71  brain]
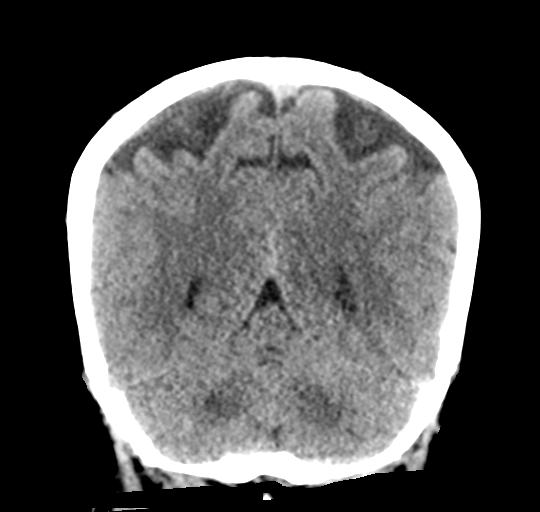
[im 32/71  brain]
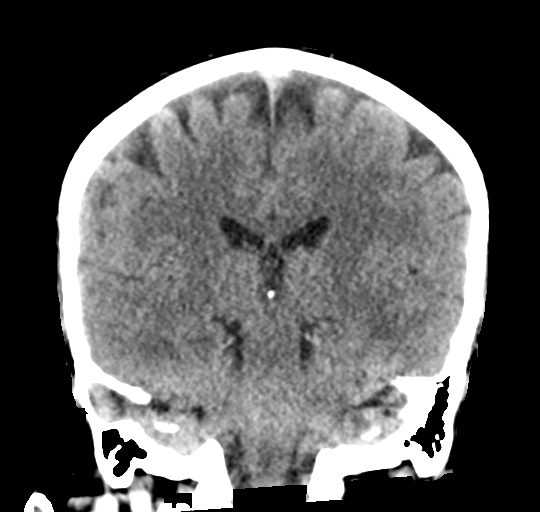
[im 39/71  brain]
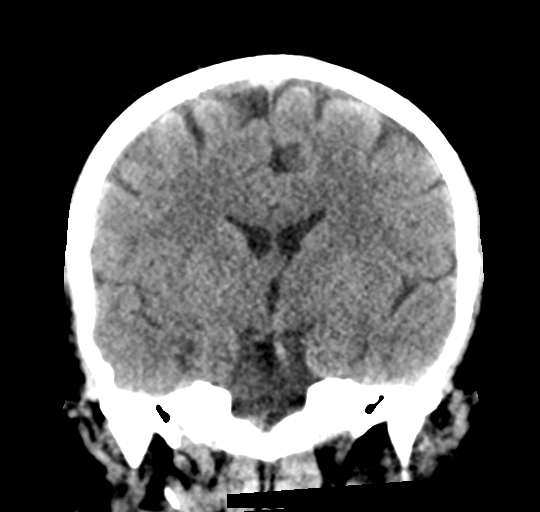

[Series 6: head without sag · sagittal · non-contrast · 0.32mm/px · 3 of 56 slices shown]
[im 19/56  brain]
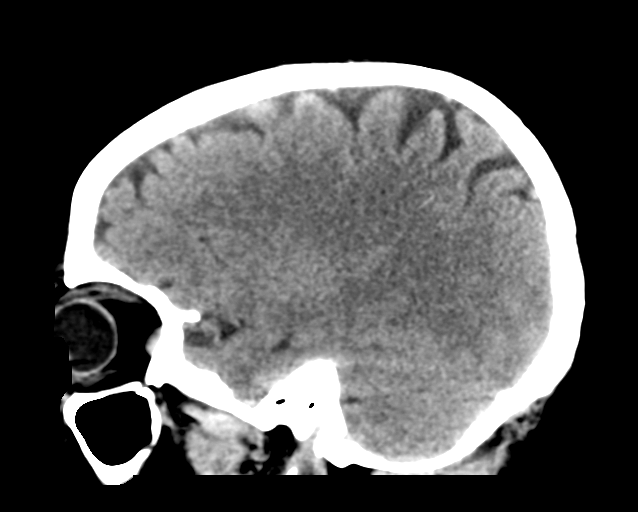
[im 28/56  brain]
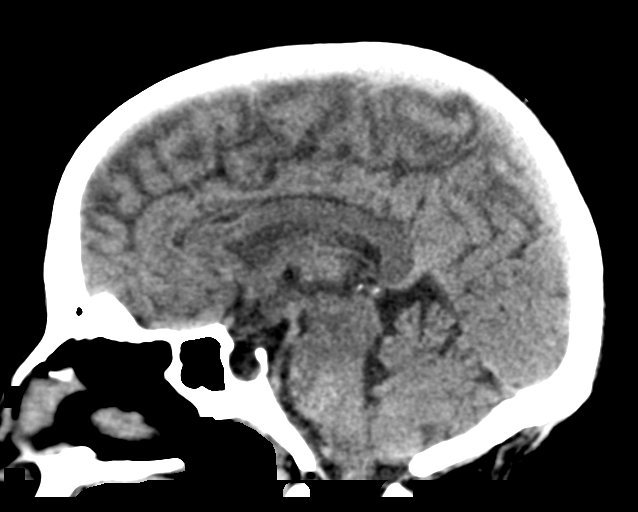
[im 37/56  brain]
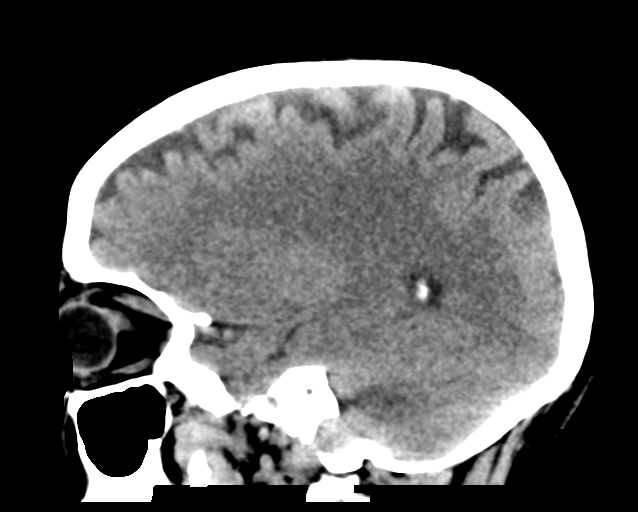

[17 of 47 positions shown; findings below may reference images not displayed]

FINDINGS: Brain: The ventricles and sulci appropriate size for patient's age.
The gray-white matter discrimination is preserved. There is no acute
intracranial hemorrhage. No mass effect or midline shift no
extra-axial fluid collection.

Vascular: No hyperdense vessel or unexpected calcification.

Skull: Normal. Negative for fracture or focal lesion.

Sinuses/Orbits: No acute finding.

Other: None
IMPRESSION: Unremarkable noncontrast CT of the brain.

## 2020-09-16 DIAGNOSIS — Z20822 Contact with and (suspected) exposure to covid-19: Secondary | ICD-10-CM | POA: Diagnosis not present

## 2020-11-20 DIAGNOSIS — Z20822 Contact with and (suspected) exposure to covid-19: Secondary | ICD-10-CM | POA: Diagnosis not present

## 2020-11-20 DIAGNOSIS — R0982 Postnasal drip: Secondary | ICD-10-CM | POA: Diagnosis not present

## 2020-11-20 DIAGNOSIS — R509 Fever, unspecified: Secondary | ICD-10-CM | POA: Diagnosis not present

## 2020-11-20 DIAGNOSIS — R0981 Nasal congestion: Secondary | ICD-10-CM | POA: Diagnosis not present

## 2020-11-20 DIAGNOSIS — R5383 Other fatigue: Secondary | ICD-10-CM | POA: Diagnosis not present

## 2020-11-24 ENCOUNTER — Other Ambulatory Visit: Payer: Self-pay

## 2020-11-24 ENCOUNTER — Ambulatory Visit (HOSPITAL_COMMUNITY)
Admission: EM | Admit: 2020-11-24 | Discharge: 2020-11-24 | Disposition: A | Discharge status: Home / Self Care | Payer: BC Managed Care – PPO | Attending: Medical Oncology | Admitting: Medical Oncology

## 2020-11-24 ENCOUNTER — Encounter (HOSPITAL_COMMUNITY): Payer: Self-pay

## 2020-11-24 DIAGNOSIS — R509 Fever, unspecified: Secondary | ICD-10-CM | POA: Diagnosis not present

## 2020-11-24 DIAGNOSIS — J029 Acute pharyngitis, unspecified: Secondary | ICD-10-CM

## 2020-11-24 DIAGNOSIS — Z3202 Encounter for pregnancy test, result negative: Secondary | ICD-10-CM | POA: Diagnosis not present

## 2020-11-24 DIAGNOSIS — Z9189 Other specified personal risk factors, not elsewhere classified: Secondary | ICD-10-CM | POA: Diagnosis not present

## 2020-11-24 LAB — POCT URINALYSIS DIPSTICK, ED / UC
Bilirubin Urine: NEGATIVE
Glucose, UA: NEGATIVE mg/dL
Ketones, ur: NEGATIVE mg/dL
Leukocytes,Ua: NEGATIVE
Nitrite: NEGATIVE
Protein, ur: NEGATIVE mg/dL
Specific Gravity, Urine: 1.015 (ref 1.005–1.030)
Urobilinogen, UA: 0.2 mg/dL (ref 0.0–1.0)
pH: 7.5 (ref 5.0–8.0)

## 2020-11-24 LAB — POC URINE PREG, ED: Preg Test, Ur: NEGATIVE

## 2020-11-24 MED ORDER — ACETAMINOPHEN 325 MG PO TABS
ORAL_TABLET | ORAL | Status: AC
  Filled 2020-11-24: qty 2

## 2020-11-24 MED ORDER — ACETAMINOPHEN 325 MG PO TABS
650.0000 mg | ORAL_TABLET | Freq: Once | ORAL | Status: AC
  Administered 2020-11-24: 650 mg via ORAL

## 2020-11-24 NOTE — ED Triage Notes (Signed)
Pt in with c/o chills, fever, fatigue and intermittent ST  Pt has been taking tylenol occasionally for sx  Pt states she traveled internationally last month

## 2020-11-24 NOTE — ED Provider Notes (Signed)
[facility]    CSN: [id number] Arrival date & time: 11/24/20  1933      History   Chief Complaint Chief Complaint  Patient presents with   Chills   Fever   Sore Throat   Urinary Frequency    HPI Malloree Raboin is a 50 y.o. female.   HPI  Cold Symptoms: Patient reports that for the past few days she has had cyclic body aches, fevers along with sore throat and chills. She also has had some urinary frequency. She has been taking B12 for symptoms with slight improvement in fatigue and tylenol for fever. She reports that two days ago she tested negative for COVID-19, influenza, strep pharyngitis. She reports that she traveled to [place] recently and wonders about Malaria.   History reviewed. No pertinent past medical history.  Patient Active Problem List   Diagnosis Date Noted   Convulsion (HCC) 09/13/2019   Single unprovoked seizure (HCC) 07/28/2019    Past Surgical History:  Procedure Laterality Date   NEPHRECTOMY      OB History   No obstetric history on file.      Home Medications    Prior to Admission medications   Medication Sig Start Date End Date Taking? Authorizing Provider  Melatonin 10 MG TABS Take 10 mg by mouth at bedtime.    [provider]  Multiple Vitamin (MULTIVITAMIN WITH MINERALS) TABS tablet Take 1 tablet by mouth daily after lunch.    [provider]  naproxen sodium (ALEVE) 220 MG tablet Take 220 mg by mouth 2 (two) times daily as needed (pain/headache).    [provider]    Family History Family History  Problem Relation Age of Onset   Diabetes Mother     Social History Social History   Tobacco Use   Smoking status: Never   Smokeless tobacco: Never  Vaping Use   Vaping Use: Never used  Substance Use Topics   Alcohol use: No   Drug use: No     Allergies   Patient has no known allergies.   Review of Systems Review of Systems  As stated above in HPI Physical Exam Triage Vital  Signs ED Triage Vitals  Enc Vitals Group     BP 11/24/20 1949 131/85     Pulse Rate 11/24/20 1949 (!) 107     Resp 11/24/20 1949 18     Temp 11/24/20 1949 (!) 101 F (38.3 C)     Temp Source 11/24/20 1949 Oral     SpO2 11/24/20 1949 97 %     Weight --      Height --      Head Circumference --      Peak Flow --      Pain Score 11/24/20 1948 0     Pain Loc --      Pain Edu? --      Excl. in GC? --    No data found.  Updated Vital Signs BP 131/85 (BP Location: Right Arm)   Pulse (!) 107   Temp (!) 101 F (38.3 C) (Oral)   Resp 18   SpO2 97%   Physical Exam Vitals and nursing note reviewed.  Constitutional:      General: She is not in acute distress.    Appearance: She is well-developed. She is not ill-appearing, toxic-appearing or diaphoretic.  HENT:     Head: Normocephalic and atraumatic.     Right Ear: Tympanic membrane normal. No middle ear effusion. Tympanic membrane  is not erythematous.     Left Ear: Tympanic membrane normal.  No middle ear effusion. Tympanic membrane is not erythematous.     Nose: No congestion or rhinorrhea.     Mouth/Throat:     Mouth: Mucous membranes are moist.     Pharynx: Oropharynx is clear. No oropharyngeal exudate, posterior oropharyngeal erythema or uvula swelling.     Tonsils: No tonsillar exudate or tonsillar abscesses.  Eyes:     Conjunctiva/sclera: Conjunctivae normal.     Pupils: Pupils are equal, round, and reactive to light.  Cardiovascular:     Rate and Rhythm: Regular rhythm. Tachycardia present.     Heart sounds: Normal heart sounds.  Pulmonary:     Effort: Pulmonary effort is normal.     Breath sounds: Normal breath sounds.  Musculoskeletal:     Cervical back: Normal range of motion and neck supple.  Lymphadenopathy:     Cervical: Cervical adenopathy present.  Skin:    General: Skin is warm.     Findings: No rash.  Neurological:     Mental Status: She is alert and oriented to person, place, and time.     UC  Treatments / Results  Labs (all labs ordered are listed, but only abnormal results are displayed) Labs Reviewed  POCT URINALYSIS DIPSTICK, ED / UC  POC URINE PREG, ED    EKG   Radiology No results found.  Procedures Procedures (including critical care time)  Medications Ordered in UC Medications  acetaminophen (TYLENOL) tablet 650 mg (650 mg Oral Given 11/24/20 1958)    Initial Impression / Assessment and Plan / UC Course  I have reviewed the triage vital signs and the nursing notes.  Pertinent labs & imaging results that were available during my care of the patient were reviewed by me and considered in my medical decision making (see chart for details).     New. Testing for Malaria. If negative this is likely viral in nature. Discussed red flag signs and symptoms with patient. Rest, hydration and treatment of fever.  Final Clinical Impressions(s) / UC Diagnoses   Final diagnoses:  None   Discharge Instructions   None    ED Prescriptions   None    PDMP not reviewed this encounter.   Rushie Chestnut, Cordelia Poche 11/24/20 2033

## 2020-11-25 ENCOUNTER — Telehealth (HOSPITAL_COMMUNITY): Payer: Self-pay

## 2020-11-25 LAB — PARASITE EXAM SCREEN, BLOOD-W CONF TO LABCORP (NOT @ ARMC)

## 2020-11-25 NOTE — Telephone Encounter (Signed)
Lab Results   Result/s: No parasite seen, lab will be sent to [facility] for confirmation  RECEIVER: Lynelle Smoke RN  DATE & TIME NOTIFIED: 11/25/2020, around 10:04am  MESSENGER: Milon Dikes  PROVIDER NOTIFIED: S. Covington PA-C  TIME OF NOTIFICATION: 10:27am  RESPONSE:  no new orders

## 2020-12-04 LAB — PARASITE EXAM, BLOOD

## 2021-03-27 DIAGNOSIS — Z124 Encounter for screening for malignant neoplasm of cervix: Secondary | ICD-10-CM | POA: Diagnosis not present

## 2021-03-27 DIAGNOSIS — Z23 Encounter for immunization: Secondary | ICD-10-CM | POA: Diagnosis not present

## 2021-03-27 DIAGNOSIS — Z0000 Encounter for general adult medical examination without abnormal findings: Secondary | ICD-10-CM | POA: Diagnosis not present

## 2021-03-27 DIAGNOSIS — Z1211 Encounter for screening for malignant neoplasm of colon: Secondary | ICD-10-CM | POA: Diagnosis not present

## 2021-03-27 DIAGNOSIS — Z1231 Encounter for screening mammogram for malignant neoplasm of breast: Secondary | ICD-10-CM | POA: Diagnosis not present

## 2021-04-07 DIAGNOSIS — Z0000 Encounter for general adult medical examination without abnormal findings: Secondary | ICD-10-CM | POA: Diagnosis not present

## 2021-04-07 DIAGNOSIS — Z124 Encounter for screening for malignant neoplasm of cervix: Secondary | ICD-10-CM | POA: Diagnosis not present

## 2021-05-24 DIAGNOSIS — Z1231 Encounter for screening mammogram for malignant neoplasm of breast: Secondary | ICD-10-CM | POA: Diagnosis not present

## 2021-05-25 DIAGNOSIS — I10 Essential (primary) hypertension: Secondary | ICD-10-CM | POA: Diagnosis not present

## 2021-06-11 DIAGNOSIS — I10 Essential (primary) hypertension: Secondary | ICD-10-CM | POA: Diagnosis not present

## 2021-07-12 DIAGNOSIS — I10 Essential (primary) hypertension: Secondary | ICD-10-CM | POA: Diagnosis not present

## 2021-09-24 DIAGNOSIS — H6121 Impacted cerumen, right ear: Secondary | ICD-10-CM | POA: Diagnosis not present

## 2021-12-04 DIAGNOSIS — R059 Cough, unspecified: Secondary | ICD-10-CM | POA: Diagnosis not present

## 2021-12-04 DIAGNOSIS — R0981 Nasal congestion: Secondary | ICD-10-CM | POA: Diagnosis not present

## 2021-12-04 DIAGNOSIS — U071 COVID-19: Secondary | ICD-10-CM | POA: Diagnosis not present

## 2021-12-04 DIAGNOSIS — R509 Fever, unspecified: Secondary | ICD-10-CM | POA: Diagnosis not present

## 2021-12-26 ENCOUNTER — Telehealth: Payer: Self-pay | Admitting: *Deleted

## 2021-12-26 NOTE — Patient Outreach (Signed)
  Care Coordination   12/26/2021 Name: [name] MRN: [id number] DOB: 10-Jan-1971   Care Coordination Outreach Attempts:  An unsuccessful telephone outreach was attempted today to offer the patient information about available care coordination services as a benefit of their health plan.   Follow Up Plan:  Additional outreach attempts will be made to offer the patient care coordination information and services.   Encounter Outcome:  No Answer  Care Coordination Interventions Activated:  No   Care Coordination Interventions:  No, not indicated    Jacqlyn Larsen [facility], [place] RN Care Coordinator [phone]

## 2022-08-30 DIAGNOSIS — K219 Gastro-esophageal reflux disease without esophagitis: Secondary | ICD-10-CM | POA: Diagnosis not present

## 2022-08-30 DIAGNOSIS — I10 Essential (primary) hypertension: Secondary | ICD-10-CM | POA: Diagnosis not present

## 2022-09-11 DIAGNOSIS — R0683 Snoring: Secondary | ICD-10-CM | POA: Diagnosis not present

## 2022-09-11 DIAGNOSIS — R400 Somnolence: Secondary | ICD-10-CM | POA: Diagnosis not present

## 2022-09-11 DIAGNOSIS — I10 Essential (primary) hypertension: Secondary | ICD-10-CM | POA: Diagnosis not present

## 2022-09-11 DIAGNOSIS — K219 Gastro-esophageal reflux disease without esophagitis: Secondary | ICD-10-CM | POA: Diagnosis not present

## 2023-01-07 ENCOUNTER — Emergency Department (HOSPITAL_BASED_OUTPATIENT_CLINIC_OR_DEPARTMENT_OTHER): Payer: BC Managed Care – PPO

## 2023-01-07 ENCOUNTER — Emergency Department (HOSPITAL_BASED_OUTPATIENT_CLINIC_OR_DEPARTMENT_OTHER)
Admission: EM | Admit: 2023-01-07 | Discharge: 2023-01-07 | Disposition: A | Discharge status: Home / Self Care | Payer: BC Managed Care – PPO | Attending: Emergency Medicine | Admitting: Emergency Medicine

## 2023-01-07 ENCOUNTER — Emergency Department (HOSPITAL_BASED_OUTPATIENT_CLINIC_OR_DEPARTMENT_OTHER): Payer: BC Managed Care – PPO | Admitting: Radiology

## 2023-01-07 ENCOUNTER — Encounter (HOSPITAL_BASED_OUTPATIENT_CLINIC_OR_DEPARTMENT_OTHER): Payer: Self-pay

## 2023-01-07 DIAGNOSIS — Z041 Encounter for examination and observation following transport accident: Secondary | ICD-10-CM | POA: Diagnosis not present

## 2023-01-07 DIAGNOSIS — M25512 Pain in left shoulder: Secondary | ICD-10-CM | POA: Diagnosis not present

## 2023-01-07 DIAGNOSIS — M25511 Pain in right shoulder: Secondary | ICD-10-CM | POA: Insufficient documentation

## 2023-01-07 DIAGNOSIS — M542 Cervicalgia: Secondary | ICD-10-CM | POA: Diagnosis not present

## 2023-01-07 DIAGNOSIS — S161XXA Strain of muscle, fascia and tendon at neck level, initial encounter: Secondary | ICD-10-CM | POA: Diagnosis not present

## 2023-01-07 DIAGNOSIS — Y92410 Unspecified street and highway as the place of occurrence of the external cause: Secondary | ICD-10-CM | POA: Diagnosis not present

## 2023-01-07 DIAGNOSIS — R519 Headache, unspecified: Secondary | ICD-10-CM | POA: Diagnosis not present

## 2023-01-07 DIAGNOSIS — S199XXA Unspecified injury of neck, initial encounter: Secondary | ICD-10-CM | POA: Diagnosis not present

## 2023-01-07 LAB — PREGNANCY, URINE: Preg Test, Ur: NEGATIVE

## 2023-01-07 MED ORDER — IBUPROFEN 800 MG PO TABS
800.0000 mg | ORAL_TABLET | Freq: Once | ORAL | Status: AC
  Administered 2023-01-07: 800 mg via ORAL
  Filled 2023-01-07: qty 1

## 2023-01-07 MED ORDER — LIDOCAINE 5 % EX PTCH
2.0000 | MEDICATED_PATCH | CUTANEOUS | Status: DC
  Administered 2023-01-07: 2 via TRANSDERMAL
  Filled 2023-01-07: qty 2

## 2023-01-07 MED ORDER — CYCLOBENZAPRINE HCL 5 MG PO TABS
5.0000 mg | ORAL_TABLET | Freq: Once | ORAL | Status: AC
  Administered 2023-01-07: 5 mg via ORAL
  Filled 2023-01-07: qty 1

## 2023-01-07 MED ORDER — CYCLOBENZAPRINE HCL 5 MG PO TABS: 5.0000 mg | ORAL_TABLET | Freq: Two times a day (BID) | ORAL | 0 refills | Status: AC | PRN
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
## 2023-03-12 DIAGNOSIS — M722 Plantar fascial fibromatosis: Secondary | ICD-10-CM | POA: Diagnosis not present

## 2023-03-12 DIAGNOSIS — K219 Gastro-esophageal reflux disease without esophagitis: Secondary | ICD-10-CM | POA: Diagnosis not present

## 2023-03-12 DIAGNOSIS — I10 Essential (primary) hypertension: Secondary | ICD-10-CM | POA: Diagnosis not present

## 2023-08-18 DIAGNOSIS — M130 Polyarthritis, unspecified: Secondary | ICD-10-CM | POA: Diagnosis not present

## 2023-08-18 DIAGNOSIS — Z905 Acquired absence of kidney: Secondary | ICD-10-CM | POA: Diagnosis not present

## 2023-08-18 DIAGNOSIS — R5383 Other fatigue: Secondary | ICD-10-CM | POA: Diagnosis not present

## 2023-08-18 DIAGNOSIS — L659 Nonscarring hair loss, unspecified: Secondary | ICD-10-CM | POA: Diagnosis not present

## 2024-02-23 DIAGNOSIS — Z20822 Contact with and (suspected) exposure to covid-19: Secondary | ICD-10-CM | POA: Diagnosis not present

## 2024-02-23 DIAGNOSIS — J020 Streptococcal pharyngitis: Secondary | ICD-10-CM | POA: Diagnosis not present

## 2024-02-23 DIAGNOSIS — J029 Acute pharyngitis, unspecified: Secondary | ICD-10-CM | POA: Diagnosis not present
